# Patient Record
Sex: Female | Born: 1985 | Race: White | Hispanic: No | Marital: Married | Smoking: Never smoker
Health system: Southern US, Community
[De-identification: ages and names within clinical notes are randomized; demographics above are authoritative.]

## PROBLEM LIST (undated history)

## (undated) ENCOUNTER — Inpatient Hospital Stay (HOSPITAL_COMMUNITY): Payer: Self-pay

## (undated) DIAGNOSIS — B999 Unspecified infectious disease: Secondary | ICD-10-CM

## (undated) DIAGNOSIS — N390 Urinary tract infection, site not specified: Secondary | ICD-10-CM

## (undated) DIAGNOSIS — Z809 Family history of malignant neoplasm, unspecified: Secondary | ICD-10-CM

## (undated) DIAGNOSIS — Z833 Family history of diabetes mellitus: Secondary | ICD-10-CM

## (undated) DIAGNOSIS — Z8249 Family history of ischemic heart disease and other diseases of the circulatory system: Secondary | ICD-10-CM

## (undated) DIAGNOSIS — B379 Candidiasis, unspecified: Secondary | ICD-10-CM

## (undated) DIAGNOSIS — Z8619 Personal history of other infectious and parasitic diseases: Secondary | ICD-10-CM

## (undated) DIAGNOSIS — A499 Bacterial infection, unspecified: Secondary | ICD-10-CM

## (undated) DIAGNOSIS — Z22330 Carrier of Group B streptococcus: Secondary | ICD-10-CM

## (undated) DIAGNOSIS — N8110 Cystocele, unspecified: Secondary | ICD-10-CM

## (undated) DIAGNOSIS — Z825 Family history of asthma and other chronic lower respiratory diseases: Secondary | ICD-10-CM

## (undated) HISTORY — DX: Family history of ischemic heart disease and other diseases of the circulatory system: Z82.49

## (undated) HISTORY — DX: Family history of asthma and other chronic lower respiratory diseases: Z82.5

## (undated) HISTORY — DX: Candidiasis, unspecified: B37.9

## (undated) HISTORY — DX: Carrier of group B Streptococcus: Z22.330

## (undated) HISTORY — DX: Family history of malignant neoplasm, unspecified: Z80.9

## (undated) HISTORY — PX: WISDOM TOOTH EXTRACTION: SHX21

## (undated) HISTORY — DX: Family history of diabetes mellitus: Z83.3

## (undated) HISTORY — DX: Bacterial infection, unspecified: A49.9

## (undated) HISTORY — DX: Personal history of other infectious and parasitic diseases: Z86.19

---

## 2010-05-12 ENCOUNTER — Inpatient Hospital Stay (HOSPITAL_COMMUNITY): Admission: AD | Admit: 2010-05-12 | Discharge: 2010-05-12 | Payer: Self-pay | Admitting: Internal Medicine

## 2010-05-13 ENCOUNTER — Inpatient Hospital Stay (HOSPITAL_COMMUNITY): Admission: AD | Admit: 2010-05-13 | Discharge: 2010-05-15 | Payer: Self-pay | Admitting: Obstetrics and Gynecology

## 2010-11-14 LAB — CBC
MCH: 33.2 pg (ref 26.0–34.0)
MCH: 34.5 pg — ABNORMAL HIGH (ref 26.0–34.0)
MCHC: 35.8 g/dL (ref 30.0–36.0)
MCV: 95.4 fL (ref 78.0–100.0)
Platelets: 116 10*3/uL — ABNORMAL LOW (ref 150–400)
Platelets: 158 10*3/uL (ref 150–400)
RDW: 12.9 % (ref 11.5–15.5)
RDW: 13.2 % (ref 11.5–15.5)

## 2011-06-11 LAB — GC/CHLAMYDIA PROBE AMP, GENITAL
Chlamydia: NEGATIVE
Gonorrhea: NEGATIVE

## 2011-06-11 LAB — ABO/RH

## 2011-09-02 NOTE — L&D Delivery Note (Signed)
Delivery Note At 10:06 AM a viable female, "Margaret Leonard", was delivered via Vaginal, Spontaneous Delivery (Presentation: Right Occiput Anterior, compound presentation of left hand).  APGAR: 9, 9; weight 8 lb 15.2 oz (4060 g).   Placenta status: Intact, Spontaneous.  Cord: 3 vessels with the following complications: None.  Cord pH: NA  Anesthesia: Epidural  Episiotomy: None Lacerations: 2nd degree;Perineal Suture Repair: 3.0 monocryl Est. Blood Loss (mL): 200 cc  Mom to postpartum.  Baby to skin to skin. Inpatient circumcision planned--R&B reviewed, consent signed.  Jerzee Jerome 12/09/2011, 10:45 AM

## 2011-10-08 ENCOUNTER — Encounter (HOSPITAL_COMMUNITY): Payer: Self-pay | Admitting: *Deleted

## 2011-10-08 ENCOUNTER — Inpatient Hospital Stay (HOSPITAL_COMMUNITY)
Admission: AD | Admit: 2011-10-08 | Discharge: 2011-10-08 | Disposition: A | Discharge status: Home / Self Care | Payer: BC Managed Care – PPO | Source: Ambulatory Visit | Attending: Obstetrics and Gynecology | Admitting: Obstetrics and Gynecology

## 2011-10-08 DIAGNOSIS — S93409A Sprain of unspecified ligament of unspecified ankle, initial encounter: Secondary | ICD-10-CM | POA: Insufficient documentation

## 2011-10-08 DIAGNOSIS — W108XXA Fall (on) (from) other stairs and steps, initial encounter: Secondary | ICD-10-CM | POA: Insufficient documentation

## 2011-10-08 DIAGNOSIS — N898 Other specified noninflammatory disorders of vagina: Secondary | ICD-10-CM

## 2011-10-08 DIAGNOSIS — O99891 Other specified diseases and conditions complicating pregnancy: Secondary | ICD-10-CM | POA: Insufficient documentation

## 2011-10-08 DIAGNOSIS — T1490XA Injury, unspecified, initial encounter: Secondary | ICD-10-CM

## 2011-10-08 HISTORY — DX: Urinary tract infection, site not specified: N39.0

## 2011-10-08 LAB — AMNISURE RUPTURE OF MEMBRANE (ROM) NOT AT ARMC: Amnisure ROM: NEGATIVE

## 2011-10-08 NOTE — ED Notes (Signed)
Charting at 1740-1745-wrong pt

## 2011-10-08 NOTE — Progress Notes (Addendum)
Pt taken to rm, CNM here for assesment.  Going down steps, 1500 02/05, missed last step, ankle knee took the worst of it.  Saw orthopedic.

## 2011-10-08 NOTE — Progress Notes (Addendum)
History     Chief Complaint  Patient presents with  . Laqueta Jean [facility] [birth year] presents with c/o fell off bottom step when ankle twisted and landed on foot and knee and side at 1500 on 10/07/2011 denies vag bleeding or uc or abd pain states bruising to R ankle and Dr. Alfonzo Beers today with walking boot for a sprained ankle, with trickle of fluid at 1845 2/5 x 1 none since.  @SFHPI @  OB History    Grav Para Term Preterm Abortions TAB SAB Ect Mult Living   1               No past medical history on file.  History reviewed. No pertinent past surgical history.  No family history on file.  History  Substance Use Topics  . Smoking status: Not on file  . Smokeless tobacco: Not on file  . Alcohol Use: Not on file    Allergies: Allergies not on file  No prescriptions prior to admission    @ROS @ Physical Exam  Cheerful, talkative, no obvious distress, abd soft, gravid, nt SSE white discharge, cervix LTC, wet prep and amnisure sent to lab Blood pressure 104/62, pulse 82, temperature 97.3 F (36.3 C), temperature source Oral, resp. rate 20.  @PHYSEXAMBYAGE2 @  ED Course  33 week IUP Post fall spained R ankle P Amnisure and wet prep pending Lavera Guise, CNM   Addendum: neg amnisure, neg wet prep, fhts with reactive NST, discharge s/s ptl abd pain to report, f/o office as schedule. Collaboration per telephone with Dr. Normand Sloop. Lavera Guise, CNM at [phone]

## 2011-10-08 NOTE — Progress Notes (Deleted)
Running up steps, fell and hit abd, knees.  Red areas on knees, not on abd.  "knocked the breath out of me" (happended around 4)

## 2011-10-23 LAB — STREP B DNA PROBE: GBS: NEGATIVE

## 2011-10-31 ENCOUNTER — Encounter (INDEPENDENT_AMBULATORY_CARE_PROVIDER_SITE_OTHER): Payer: BC Managed Care – PPO

## 2011-10-31 DIAGNOSIS — Z331 Pregnant state, incidental: Secondary | ICD-10-CM

## 2011-11-07 ENCOUNTER — Encounter (INDEPENDENT_AMBULATORY_CARE_PROVIDER_SITE_OTHER): Payer: BC Managed Care – PPO | Admitting: Obstetrics and Gynecology

## 2011-11-07 ENCOUNTER — Encounter: Payer: BC Managed Care – PPO | Admitting: Obstetrics and Gynecology

## 2011-11-07 DIAGNOSIS — Z331 Pregnant state, incidental: Secondary | ICD-10-CM

## 2011-11-07 DIAGNOSIS — N898 Other specified noninflammatory disorders of vagina: Secondary | ICD-10-CM

## 2011-11-14 ENCOUNTER — Encounter (INDEPENDENT_AMBULATORY_CARE_PROVIDER_SITE_OTHER): Payer: BC Managed Care – PPO | Admitting: Registered Nurse

## 2011-11-14 DIAGNOSIS — Z331 Pregnant state, incidental: Secondary | ICD-10-CM

## 2011-11-19 ENCOUNTER — Other Ambulatory Visit (INDEPENDENT_AMBULATORY_CARE_PROVIDER_SITE_OTHER): Payer: BC Managed Care – PPO

## 2011-11-19 ENCOUNTER — Encounter (INDEPENDENT_AMBULATORY_CARE_PROVIDER_SITE_OTHER): Payer: BC Managed Care – PPO | Admitting: Registered Nurse

## 2011-11-19 DIAGNOSIS — Z331 Pregnant state, incidental: Secondary | ICD-10-CM

## 2011-11-19 DIAGNOSIS — O26849 Uterine size-date discrepancy, unspecified trimester: Secondary | ICD-10-CM

## 2011-11-27 ENCOUNTER — Encounter: Payer: BC Managed Care – PPO | Admitting: Registered Nurse

## 2011-11-27 ENCOUNTER — Encounter (INDEPENDENT_AMBULATORY_CARE_PROVIDER_SITE_OTHER): Payer: BC Managed Care – PPO

## 2011-11-27 ENCOUNTER — Encounter (INDEPENDENT_AMBULATORY_CARE_PROVIDER_SITE_OTHER): Payer: BC Managed Care – PPO | Admitting: Registered Nurse

## 2011-11-27 DIAGNOSIS — Z331 Pregnant state, incidental: Secondary | ICD-10-CM

## 2011-11-27 DIAGNOSIS — O409XX0 Polyhydramnios, unspecified trimester, not applicable or unspecified: Secondary | ICD-10-CM

## 2011-11-27 DIAGNOSIS — O480 Post-term pregnancy: Secondary | ICD-10-CM

## 2011-12-03 ENCOUNTER — Encounter (INDEPENDENT_AMBULATORY_CARE_PROVIDER_SITE_OTHER): Payer: BC Managed Care – PPO | Admitting: Registered Nurse

## 2011-12-03 DIAGNOSIS — O480 Post-term pregnancy: Secondary | ICD-10-CM

## 2011-12-03 DIAGNOSIS — Z331 Pregnant state, incidental: Secondary | ICD-10-CM

## 2011-12-05 ENCOUNTER — Encounter (INDEPENDENT_AMBULATORY_CARE_PROVIDER_SITE_OTHER): Payer: BC Managed Care – PPO | Admitting: Obstetrics and Gynecology

## 2011-12-05 ENCOUNTER — Other Ambulatory Visit: Payer: BC Managed Care – PPO

## 2011-12-08 ENCOUNTER — Encounter (HOSPITAL_COMMUNITY): Payer: Self-pay | Admitting: *Deleted

## 2011-12-08 ENCOUNTER — Telehealth (HOSPITAL_COMMUNITY): Payer: Self-pay | Admitting: *Deleted

## 2011-12-08 NOTE — Telephone Encounter (Signed)
Preadmission screen  

## 2011-12-09 ENCOUNTER — Encounter (HOSPITAL_COMMUNITY): Payer: Self-pay | Admitting: Anesthesiology

## 2011-12-09 ENCOUNTER — Inpatient Hospital Stay (HOSPITAL_COMMUNITY)
Admission: RE | Admit: 2011-12-09 | Discharge: 2011-12-11 | DRG: 372 | Disposition: A | Discharge status: Home / Self Care | Payer: BC Managed Care – PPO | Source: Ambulatory Visit | Attending: Obstetrics and Gynecology | Admitting: Obstetrics and Gynecology

## 2011-12-09 ENCOUNTER — Encounter (HOSPITAL_COMMUNITY): Payer: Self-pay

## 2011-12-09 ENCOUNTER — Inpatient Hospital Stay (HOSPITAL_COMMUNITY): Payer: BC Managed Care – PPO | Admitting: Anesthesiology

## 2011-12-09 DIAGNOSIS — Z3490 Encounter for supervision of normal pregnancy, unspecified, unspecified trimester: Secondary | ICD-10-CM

## 2011-12-09 DIAGNOSIS — O328XX0 Maternal care for other malpresentation of fetus, not applicable or unspecified: Secondary | ICD-10-CM | POA: Diagnosis present

## 2011-12-09 DIAGNOSIS — D696 Thrombocytopenia, unspecified: Secondary | ICD-10-CM | POA: Diagnosis not present

## 2011-12-09 DIAGNOSIS — D689 Coagulation defect, unspecified: Secondary | ICD-10-CM | POA: Diagnosis not present

## 2011-12-09 LAB — CBC
Hemoglobin: 12.7 g/dL (ref 12.0–15.0)
MCH: 31.9 pg (ref 26.0–34.0)
MCHC: 34.1 g/dL (ref 30.0–36.0)
MCV: 93.5 fL (ref 78.0–100.0)
Platelets: 132 10*3/uL — ABNORMAL LOW (ref 150–400)
RBC: 3.98 MIL/uL (ref 3.87–5.11)

## 2011-12-09 LAB — RPR: RPR Ser Ql: NONREACTIVE

## 2011-12-09 MED ORDER — EPHEDRINE 5 MG/ML INJ: 10.0000 mg | INTRAVENOUS | Status: DC | PRN

## 2011-12-09 MED ORDER — LANOLIN HYDROUS EX OINT: TOPICAL_OINTMENT | CUTANEOUS | Status: DC | PRN

## 2011-12-09 MED ORDER — OXYTOCIN BOLUS FROM INFUSION
500.0000 mL | Freq: Once | INTRAVENOUS | Status: DC
  Filled 2011-12-09: qty 1000
  Filled 2011-12-09: qty 500

## 2011-12-09 MED ORDER — OXYTOCIN 20 UNITS IN LACTATED RINGERS INFUSION - SIMPLE
125.0000 mL/h | Freq: Once | INTRAVENOUS | Status: AC
  Administered 2011-12-09: 999 mL/h via INTRAVENOUS

## 2011-12-09 MED ORDER — ONDANSETRON HCL 4 MG PO TABS: 4.0000 mg | ORAL_TABLET | ORAL | Status: DC | PRN

## 2011-12-09 MED ORDER — IBUPROFEN 600 MG PO TABS
600.0000 mg | ORAL_TABLET | Freq: Four times a day (QID) | ORAL | Status: DC
  Administered 2011-12-09 – 2011-12-11 (×9): 600 mg via ORAL
  Filled 2011-12-09 (×8): qty 1

## 2011-12-09 MED ORDER — PHENYLEPHRINE 40 MCG/ML (10ML) SYRINGE FOR IV PUSH (FOR BLOOD PRESSURE SUPPORT)
80.0000 ug | PREFILLED_SYRINGE | INTRAVENOUS | Status: DC | PRN
  Filled 2011-12-09: qty 5

## 2011-12-09 MED ORDER — LACTATED RINGERS IV SOLN
INTRAVENOUS | Status: DC
  Administered 2011-12-09: 06:00:00 via INTRAVENOUS

## 2011-12-09 MED ORDER — DIPHENHYDRAMINE HCL 50 MG/ML IJ SOLN: 12.5000 mg | INTRAMUSCULAR | Status: DC | PRN

## 2011-12-09 MED ORDER — SIMETHICONE 80 MG PO CHEW: 80.0000 mg | CHEWABLE_TABLET | ORAL | Status: DC | PRN

## 2011-12-09 MED ORDER — LACTATED RINGERS IV SOLN: 500.0000 mL | Freq: Once | INTRAVENOUS | Status: DC

## 2011-12-09 MED ORDER — PHENYLEPHRINE 40 MCG/ML (10ML) SYRINGE FOR IV PUSH (FOR BLOOD PRESSURE SUPPORT): 80.0000 ug | PREFILLED_SYRINGE | INTRAVENOUS | Status: DC | PRN

## 2011-12-09 MED ORDER — OXYCODONE-ACETAMINOPHEN 5-325 MG PO TABS: 1.0000 | ORAL_TABLET | ORAL | Status: DC | PRN

## 2011-12-09 MED ORDER — FENTANYL 2.5 MCG/ML BUPIVACAINE 1/10 % EPIDURAL INFUSION (WH - ANES)
14.0000 mL/h | INTRAMUSCULAR | Status: DC
  Administered 2011-12-09: 14 mL/h via EPIDURAL
  Filled 2011-12-09 (×2): qty 60

## 2011-12-09 MED ORDER — LACTATED RINGERS IV SOLN: 500.0000 mL | INTRAVENOUS | Status: DC | PRN

## 2011-12-09 MED ORDER — SENNOSIDES-DOCUSATE SODIUM 8.6-50 MG PO TABS
2.0000 | ORAL_TABLET | Freq: Every day | ORAL | Status: DC
  Administered 2011-12-09 – 2011-12-10 (×2): 2 via ORAL

## 2011-12-09 MED ORDER — DIBUCAINE 1 % RE OINT: 1.0000 "application " | TOPICAL_OINTMENT | RECTAL | Status: DC | PRN

## 2011-12-09 MED ORDER — ACETAMINOPHEN 325 MG PO TABS: 650.0000 mg | ORAL_TABLET | ORAL | Status: DC | PRN

## 2011-12-09 MED ORDER — ONDANSETRON HCL 4 MG/2ML IJ SOLN: 4.0000 mg | Freq: Four times a day (QID) | INTRAMUSCULAR | Status: DC | PRN

## 2011-12-09 MED ORDER — ONDANSETRON HCL 4 MG/2ML IJ SOLN: 4.0000 mg | INTRAMUSCULAR | Status: DC | PRN

## 2011-12-09 MED ORDER — TETANUS-DIPHTH-ACELL PERTUSSIS 5-2.5-18.5 LF-MCG/0.5 IM SUSP: 0.5000 mL | Freq: Once | INTRAMUSCULAR | Status: DC

## 2011-12-09 MED ORDER — FENTANYL 2.5 MCG/ML BUPIVACAINE 1/10 % EPIDURAL INFUSION (WH - ANES)
INTRAMUSCULAR | Status: DC | PRN
  Administered 2011-12-09: 14 mL/h via EPIDURAL

## 2011-12-09 MED ORDER — LIDOCAINE HCL (PF) 1 % IJ SOLN
30.0000 mL | INTRAMUSCULAR | Status: DC | PRN
  Filled 2011-12-09 (×2): qty 30

## 2011-12-09 MED ORDER — WITCH HAZEL-GLYCERIN EX PADS: 1.0000 "application " | MEDICATED_PAD | CUTANEOUS | Status: DC | PRN

## 2011-12-09 MED ORDER — BENZOCAINE-MENTHOL 20-0.5 % EX AERO
INHALATION_SPRAY | CUTANEOUS | Status: AC
  Administered 2011-12-09: 15:00:00
  Filled 2011-12-09: qty 56

## 2011-12-09 MED ORDER — DIPHENHYDRAMINE HCL 25 MG PO CAPS: 25.0000 mg | ORAL_CAPSULE | Freq: Four times a day (QID) | ORAL | Status: DC | PRN

## 2011-12-09 MED ORDER — IBUPROFEN 600 MG PO TABS
600.0000 mg | ORAL_TABLET | Freq: Four times a day (QID) | ORAL | Status: DC | PRN
  Administered 2011-12-09: 600 mg via ORAL
  Filled 2011-12-09: qty 1

## 2011-12-09 MED ORDER — ZOLPIDEM TARTRATE 5 MG PO TABS: 5.0000 mg | ORAL_TABLET | Freq: Every evening | ORAL | Status: DC | PRN

## 2011-12-09 MED ORDER — LIDOCAINE HCL (PF) 1 % IJ SOLN
INTRAMUSCULAR | Status: DC | PRN
  Administered 2011-12-09: 4 mL
  Administered 2011-12-09: 30 mL
  Administered 2011-12-09: 4 mL

## 2011-12-09 MED ORDER — BENZOCAINE-MENTHOL 20-0.5 % EX AERO: 1.0000 "application " | INHALATION_SPRAY | CUTANEOUS | Status: DC | PRN

## 2011-12-09 MED ORDER — CITRIC ACID-SODIUM CITRATE 334-500 MG/5ML PO SOLN: 30.0000 mL | ORAL | Status: DC | PRN

## 2011-12-09 MED ORDER — PRENATAL MULTIVITAMIN CH
1.0000 | ORAL_TABLET | Freq: Every day | ORAL | Status: DC
  Administered 2011-12-09 – 2011-12-11 (×3): 1 via ORAL
  Filled 2011-12-09 (×3): qty 1

## 2011-12-09 MED ORDER — OXYCODONE-ACETAMINOPHEN 5-325 MG PO TABS
1.0000 | ORAL_TABLET | ORAL | Status: DC | PRN
  Administered 2011-12-09 – 2011-12-10 (×2): 1 via ORAL
  Administered 2011-12-10: 2 via ORAL
  Administered 2011-12-10: 1 via ORAL
  Filled 2011-12-09 (×3): qty 1
  Filled 2011-12-09: qty 2

## 2011-12-09 MED ORDER — FLEET ENEMA 7-19 GM/118ML RE ENEM: 1.0000 | ENEMA | RECTAL | Status: DC | PRN

## 2011-12-09 MED ORDER — EPHEDRINE 5 MG/ML INJ
10.0000 mg | INTRAVENOUS | Status: DC | PRN
  Filled 2011-12-09: qty 4

## 2011-12-09 NOTE — Anesthesia Procedure Notes (Signed)
Epidural Patient location during procedure: OB Start time: 12/09/2011 6:42 AM  Staffing Anesthesiologist: Malen Gauze, Ahlaya Ende A. Performed by: anesthesiologist   Preanesthetic Checklist Completed: patient identified, site marked, surgical consent, pre-op evaluation, timeout performed, IV checked, risks and benefits discussed and monitors and equipment checked  Epidural Patient position: sitting Prep: site prepped and draped and DuraPrep Patient monitoring: continuous pulse ox and blood pressure Approach: midline Injection technique: LOR air  Needle:  Needle type: Tuohy  Needle gauge: 17 G Needle length: 9 cm Needle insertion depth: 4 cm Catheter type: closed end flexible Catheter size: 19 Gauge Catheter at skin depth: 9 cm Test dose: negative and Other  Assessment Events: blood not aspirated, injection not painful, no injection resistance, negative IV test and no paresthesia  Additional Notes Patient identified. Risks and benefits discussed including failed block, incomplete  Pain control, post dural puncture headache, nerve damage, paralysis, blood pressure Changes, nausea, vomiting, reactions to medications-both toxic and allergic and post Partum back pain. All questions were answered. Patient expressed understanding and wished to proceed. Sterile technique was used throughout procedure. Epidural site was Dressed with sterile barrier dressing. No paresthesias, signs of intravascular injection Or signs of intrathecal spread were encountered.  Patient was more comfortable after the epidural was dosed. Please see RN's note for documentation of vital signs and FHR which are stable.

## 2011-12-09 NOTE — Progress Notes (Signed)
  Subjective: Comfortable with epidural--feeling slight pressure with some contractions.  Objective: BP 128/79  Pulse 79  Temp 97.7 F (36.5 C)  Resp 20  Ht 5\' 5"  (1.651 m)  Wt 151 lb (68.493 kg)  BMI 25.13 kg/m2      FHT:  Category 1 UC:   regular, every 4 minutes 9 cm at 6am by RN exam.  Labs: Lab Results  Component Value Date   WBC 8.9 12/09/2011   HGB 12.7 12/09/2011   HCT 37.2 12/09/2011   MCV 93.5 12/09/2011   PLT 132* 12/09/2011    Assessment / Plan: Spontaneous labor, progressing normally  Plan: Will await increased pressure and urge to push.   Nigel Bridgeman 12/09/2011, 8:12 AM

## 2011-12-09 NOTE — Anesthesia Postprocedure Evaluation (Signed)
  Anesthesia Post Note  Patient: Margaret Leonard  Procedure(s) Performed: * No procedures listed *  Anesthesia type: Epidural  Patient location: Mother/Baby  Post pain: Pain level controlled  Post assessment: Post-op Vital signs reviewed  Last Vitals:  Filed Vitals:   12/09/11 1400  BP: 102/67  Pulse: 87  Temp: 36.7 C  Resp: 18    Post vital signs: Reviewed  Level of consciousness:alert  Complications: No apparent anesthesia complications

## 2011-12-09 NOTE — Anesthesia Preprocedure Evaluation (Addendum)

## 2011-12-10 LAB — CBC
MCH: 32.2 pg (ref 26.0–34.0)
MCHC: 34.1 g/dL (ref 30.0–36.0)
MCV: 94.7 fL (ref 78.0–100.0)
Platelets: 108 10*3/uL — ABNORMAL LOW (ref 150–400)

## 2011-12-10 NOTE — Progress Notes (Signed)
Post Partum Day 1 Subjective: no complaints, up ad lib, voiding and tolerating PO  Objective: Blood pressure 108/72, pulse 91, temperature 98.1 F (36.7 C), temperature source Oral, resp. rate 18, height 5\' 5"  (1.651 m), weight 68.493 kg (151 lb), SpO2 98.00%, unknown if currently breastfeeding.  Physical Exam:  General: alert and cooperative Lochia: appropriate Uterine Fundus: firm Incision: NA DVT Evaluation: No evidence of DVT seen on physical exam.   Basename 12/10/11 0610 12/09/11 0550  HGB 10.9* 12.7  HCT 32.0* 37.2    Assessment/Plan: Plan for discharge tomorrow, Breastfeeding, Circumcision prior to discharge and Contraception Undecided (C and F?).   LOS: 1 day   Savian Mazon V 12/10/2011, 10:42 AM

## 2011-12-11 LAB — CBC
MCHC: 33.6 g/dL (ref 30.0–36.0)
Platelets: 137 10*3/uL — ABNORMAL LOW (ref 150–400)
RDW: 13.5 % (ref 11.5–15.5)
WBC: 10.4 10*3/uL (ref 4.0–10.5)

## 2011-12-11 MED ORDER — IBUPROFEN 600 MG PO TABS: 600.0000 mg | ORAL_TABLET | Freq: Four times a day (QID) | ORAL | Status: AC | PRN

## 2011-12-11 NOTE — Discharge Instructions (Signed)

## 2011-12-11 NOTE — Discharge Summary (Signed)
Obstetric Discharge Summary Reason for Admission: onset of labor Prenatal Procedures: NST and ultrasound Intrapartum Procedures: spontaneous vaginal delivery Postpartum Procedures: none Complications-Operative and Postpartum: 2nd degree perineal laceration, mild throbocytopenia Hemoglobin  Date Value Range Status  12/11/2011 11.1* 12.0-15.0 (g/dL) Final     HCT  Date Value Range Status  12/11/2011 33.0* 36.0-46.0 (%) Final   Results for orders placed during the hospital encounter of 12/09/11 (from the past 48 hour(s))  CBC     Status: Abnormal   Collection Time   12/10/11  6:10 AM      Component Value Range Comment   WBC 10.6 (*) 4.0 - 10.5 (K/uL)    RBC 3.38 (*) 3.87 - 5.11 (MIL/uL)    Hemoglobin 10.9 (*) 12.0 - 15.0 (g/dL)    HCT 76.1 (*) 60.7 - 46.0 (%)    MCV 94.7  78.0 - 100.0 (fL)    MCH 32.2  26.0 - 34.0 (pg)    MCHC 34.1  30.0 - 36.0 (g/dL)    RDW 37.1  06.2 - 69.4 (%)    Platelets 108 (*) 150 - 400 (K/uL)   CBC     Status: Abnormal   Collection Time   12/11/11  7:30 AM      Component Value Range Comment   WBC 10.4  4.0 - 10.5 (K/uL)    RBC 3.44 (*) 3.87 - 5.11 (MIL/uL)    Hemoglobin 11.1 (*) 12.0 - 15.0 (g/dL)    HCT 85.4 (*) 62.7 - 46.0 (%)    MCV 95.9  78.0 - 100.0 (fL)    MCH 32.3  26.0 - 34.0 (pg)    MCHC 33.6  30.0 - 36.0 (g/dL)    RDW 03.5  00.9 - 38.1 (%)    Platelets 137 (*) 150 - 400 (K/uL)      Hospital Course: Admitted in labor 12/09/11.  Negative GBS. Progressed spontaneously, with epidural placed. Delivery was performed by Nigel Bridgeman, CNM, without complication. Patient and baby tolerated the procedure without difficulty, with  2nd degree perineal laceration noted. Infant to FTN. Mother and infant then had an uncomplicated postpartum course, with breast feeding going well. Mom's physical exam was WNL, and she was discharged home in stable condition. Contraception plan was condoms/foam.  She received adequate benefit from po pain medications, and was d/c'd  home with Rx for Motrin.  Platelet count was 132 on admission, 108 PP day 1, and 137 PP day 2 (179 at NOB).  Will consider recheck at 6 weeks.   Physical Exam:  General: alert Lochia: appropriate Uterine Fundus: firm Incision: healing well DVT Evaluation: No evidence of DVT seen on physical exam. Negative Homan's sign.  Discharge Diagnoses: Term Pregnancy-delivered, Mild thrombocytopenia  Discharge Information: Date: 12/11/2011 Activity: Per [facility] handout Diet: routine Medications: Ibuprofen Condition: stable Instructions: refer to practice specific booklet Discharge to: home Follow-up Information    Follow up with Nigel Bridgeman, CNM in 6 weeks. (As scheduled or as needed)    Contact information:   [address] [place] [address] [phone]          Newborn Data: Live born female  Birth Weight: 8 lb 15.2 oz (4060 g) APGAR: 9, 9  Home with mother.  Nigel Bridgeman 12/11/2011, 8:51 AM

## 2011-12-14 ENCOUNTER — Telehealth: Payer: Self-pay | Admitting: Obstetrics and Gynecology

## 2011-12-14 NOTE — Telephone Encounter (Signed)
TC from patient--SVB 4/9, without complications. Temp 99.9 today, feels flu-like symptoms. No N/V, breast symptoms (except for milk coming in today), dysuria, or any Other symptoms.  No one has been ill.  Bleeding small amount, no pelvic pain.  Plan: Will alternate Tylenol and Motrin for q 3 hour dosing of antipyretic. Push po fluids. Rest and continue breastfeeding. Office will call to check on patient in am, and patient is To call with any evidence of mastitis, etc.

## 2011-12-17 NOTE — H&P (Signed)
Margaret Leonard is a 26 y.o.married white female presenting at 41.6 weeks with CC of SROM at 0400, clear fluid, and onset of ctxs thereafter.  Denies any recent illness or fever; no UTI or PIH s/s.  Reports GFM this morning.  No VB.  Pt was scheduled to come in for induction later this morning, had she not gone into spontaneous labor.  She is accompanied by her husband.  She is requesting an epidural.   Pt's prenatal care has been overall unremarkable.  She started care around 16 weeks.  She had normal anatomy scan and quad screen.  She did have a rt ankle sprain around 34 weeks.  FH measured size less than dates late 3rd trimester, and had normal growth u/s at 39 weeks w/ EFW=76%.    OB HX: G1=SVD 2011; female=8+3 at 39.5 weeks; PIH G2=current pregnancy  Maternal Medical History:  Reason for admission: Reason for admission: rupture of membranes and contractions.  Contractions: Onset was 1-2 hours ago.   Frequency: regular.   Perceived severity is moderate.    Fetal activity: Perceived fetal activity is normal.   Last perceived fetal movement was within the past hour.    Prenatal complications: 1.  Several medication allergies:  Ceclor, Vancomycin causes Redman Syndrome; also Tdap. 2.  Late to care 3.  FH polydactyly 4.  H/o PIH last pregnancy      OB History    Grav Para Term Preterm Abortions TAB SAB Ect Mult Living   2 2 2  0 0 0 0 0 0 2     Past Medical History  Diagnosis Date  . Complication of anesthesia   . Urinary tract infection    Past Surgical History  Procedure Date  . No past surgeries    Family History: family history includes Birth defects in her mother; Cancer in her paternal grandmother; Diabetes in her maternal grandfather; Heart disease in her paternal grandfather; Hypertension in her maternal grandmother and mother; and Other in her cousin.  There is no history of Anesthesia problems. Social History:  reports that she has never smoked. She has never used  smokeless tobacco. 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  Her husband "Susy Frizzle" has had 20 yrs of education and is a Engineer, production.  Review of Systems  Constitutional: Negative.   HENT: Negative.   Eyes: Negative.   Respiratory: Negative.   Cardiovascular: Negative.   Gastrointestinal: Positive for diarrhea.       Loose stools recently--primarily when more frequent ctxs  Genitourinary: Negative.   Skin: Negative.   Neurological: Negative.     Blood pressure 98/61, pulse 90, temperature 98 F (36.7 C), temperature source Oral, resp. rate 16, height 5\' 5"  (1.651 m), weight 68.493 kg (151 lb), SpO2 98.00%, unknown if currently breastfeeding. Maternal Exam:  Uterine Assessment: Contraction strength is moderate.  Contraction frequency is regular.  UC's 6-7 min  Abdomen: Patient reports no abdominal tenderness. Estimated fetal weight is 8-9 lbs.   Fetal presentation: vertex  Introitus: not evaluated.   Pelvis: adequate for delivery.   Cervix: Cervix evaluated by digital exam.     Fetal Exam Fetal Monitor Review: Mode: ultrasound.   Baseline rate: 125.  Variability: moderate (6-25 bpm).   Pattern: accelerations present and no decelerations.    Fetal State Assessment: Category I - tracings are normal.     Physical Exam  Constitutional: She appears well-developed and  well-nourished. She appears distressed.       Breathing w/ ctxs and squirmy in bed w/ them  HENT:  Head: Normocephalic and atraumatic.  Cardiovascular: Normal rate.   Respiratory: Effort normal and breath sounds normal.  GI: Soft.       gravid  Genitourinary:       cx per RN on arrival =6.5/90/-1  Skin: Skin is warm.       Skin clammy, diaphoretic    Prenatal labs: ABO, Rh: A/Positive/-- (10/10 0000) Antibody: Negative (10/10 0000) Rubella: Immune (10/10 0000) RPR: NON REACTIVE (04/09 0550)  HBsAg: Negative (10/10 0000)  HIV:  Non-reactive (10/10 0000)  GBS: Negative (02/21 0000)  Quad screen negative 1hr gtt WNL Assessment/Plan: 1.  IUP at 41.6 2. SROM at 0400 3.  Active labor s/p SROM 4.  GBS neg 5.  Cat I FHT 6.  Desires epidural  1.  Admit to [facility] w/ Dr. Estanislado Pandy as attending w/ routine L&D orders 2.  Epidural ASAP 3.  Pitocin prn augmentation 4.  Support as needed 5.  Anticipate SVD 6.  C/w MD prn   Laure Leone H 12/17/2011, 12:10 AM

## 2011-12-24 ENCOUNTER — Telehealth: Payer: Self-pay | Admitting: Obstetrics and Gynecology

## 2011-12-24 NOTE — Telephone Encounter (Signed)
PT CALLED IS 2WKS SVD, STATES "FEELS LIKE A BALL INSIDE HER VAGINA" THAT IS CAUSING PRESSURE WHEN SHE URINATES, TRIED TO URINATE AND NOTHING CAME OUT, IS NOT HAVING ANY PAIN, BLDG IS DECREASING, PT SCHED APPT ON TOMORROW 12/25/11 @ 1630 W/ SL.

## 2011-12-25 ENCOUNTER — Encounter: Payer: Self-pay | Admitting: Obstetrics and Gynecology

## 2011-12-25 ENCOUNTER — Ambulatory Visit (INDEPENDENT_AMBULATORY_CARE_PROVIDER_SITE_OTHER): Payer: BC Managed Care – PPO | Admitting: Obstetrics and Gynecology

## 2011-12-25 VITALS — BP 112/60 | Wt 137.0 lb

## 2011-12-25 DIAGNOSIS — N8111 Cystocele, midline: Secondary | ICD-10-CM

## 2011-12-25 DIAGNOSIS — R300 Dysuria: Secondary | ICD-10-CM

## 2011-12-25 DIAGNOSIS — IMO0002 Reserved for concepts with insufficient information to code with codable children: Secondary | ICD-10-CM

## 2011-12-25 LAB — POCT URINALYSIS DIPSTICK
Ketones, UA: NEGATIVE
Leukocytes, UA: NEGATIVE
Nitrite, UA: NEGATIVE
Protein, UA: NEGATIVE

## 2011-12-25 NOTE — Progress Notes (Signed)
Vaginal discharge: yellowthick mucoid Itching / Burning: yes Fever: no  Symptoms have been present for 4 days. Has used over-the-counter treatment: no Associated symptoms:  Pelvic pain: no       Dyspareunia: yes     Odor:  yes  History of STD:  no history of PID, STD's STD screen:declined  Vaginal Discharge/Discomfort/Itching  Subjective:   Margaret Leonard is an 26 y.o. woman who presents c/o pressure under the bladder for 4 days. Mild dysuria.Incomplete emptying.Not concerned about normal lochia.  Objective: no lesions, no discharge, no CMT, no adnexal masses B and Grade 4 cystocele Vaginal discharge: no vaginal discharge Vaginal lesions:  none    Assessment: Post-partum cystocele  Plan: Kegels 2x daily to reevaluate at [facility] visit. +/- Physical therapy?     Follow-up: as scheduled  Ronen Bromwell A MD 12/25/2011 3:12 PM

## 2012-01-15 ENCOUNTER — Ambulatory Visit (INDEPENDENT_AMBULATORY_CARE_PROVIDER_SITE_OTHER): Payer: BC Managed Care – PPO | Admitting: Obstetrics and Gynecology

## 2012-01-15 ENCOUNTER — Encounter: Payer: Self-pay | Admitting: Obstetrics and Gynecology

## 2012-01-15 VITALS — BP 110/70 | Resp 14

## 2012-01-15 DIAGNOSIS — O99113 Other diseases of the blood and blood-forming organs and certain disorders involving the immune mechanism complicating pregnancy, third trimester: Secondary | ICD-10-CM | POA: Insufficient documentation

## 2012-01-15 DIAGNOSIS — IMO0002 Reserved for concepts with insufficient information to code with codable children: Secondary | ICD-10-CM | POA: Insufficient documentation

## 2012-01-15 DIAGNOSIS — D696 Thrombocytopenia, unspecified: Secondary | ICD-10-CM | POA: Insufficient documentation

## 2012-01-15 LAB — POCT URINALYSIS DIPSTICK
Bilirubin, UA: NEGATIVE
Leukocytes, UA: NEGATIVE
Nitrite, UA: NEGATIVE
Protein, UA: NEGATIVE
Urobilinogen, UA: NEGATIVE
pH, UA: 7

## 2012-01-15 LAB — CBC
MCHC: 33.7 g/dL (ref 30.0–36.0)
Platelets: 190 10*3/uL (ref 150–400)
RDW: 12.3 % (ref 11.5–15.5)

## 2012-01-15 NOTE — Progress Notes (Signed)
Date of delivery: 12/09/2011 Female Name: Margaret Leonard Vaginal delivery:yes Cesarean section:no Tubal ligation:no GDM:no Breast Feeding:yes Bottle Feeding:no Post-Partum Blues:no Abnormal pap:no Normal GU function: no - c/o prolapse bladder with leakage Normal GI function:yes Returning to work:no  Pt doesn't desire BC at this time  EPDS 4

## 2012-01-15 NOTE — Progress Notes (Signed)
Subjective:     Margaret Leonard is a 26 y.o. female who presents for a postpartum visit.  I have fully reviewed the prenatal and intrapartum course.  Was seen in April by Dr. Estanislado Pandy for pp cystocele.  Patient doing Kegels, no further pelvic/bladder pressure or dysuria.      Patient is not sexually active yet.  The following portions of the patient's history were reviewed and updated as appropriate: allergies, current medications, past family history, past medical history, past social history, past surgical history and problem list.  Review of Systems Pertinent items are noted in HPI.   Objective:    BP 110/70  Resp 14  Breastfeeding? Yes  General:  alert, cooperative and no distress     Lungs: clear to auscultation bilaterally  Heart:  regular rate and rhythm, S1, S2 normal, no murmur  Abdomen: soft, non-tender; bowel sounds normal; no masses,  no organomegaly   Vulva:  normal  Vagina: normal vagina  Cervix:  normal  Corpus: normal size, contour, position, consistency, mobility, non-tender  Adnexa:  normal adnexa       Mild cyctocele noted with intrabdominal pressure. No leaking of urine on exam. Palpable Kegel strength = approx 1/3 of expected strength.      Assessment:     Normal postpartum exam.  Pap smear not done at today's visit.  Improvement of pp cystocele.  Plan:     1. Contraception: condoms 2. Follow up in: January 2014 for pap or prn for any issues.   3. CBC today.  4. Continue Kegels--call if any issues do not continue to improve.  Would send to PT in that situation.  Nigel Bridgeman MD 01/15/2012 9:12 AM

## 2013-05-04 LAB — OB RESULTS CONSOLE GC/CHLAMYDIA
Chlamydia: NEGATIVE
Gonorrhea: NEGATIVE

## 2013-05-04 LAB — OB RESULTS CONSOLE HIV ANTIBODY (ROUTINE TESTING): HIV: NONREACTIVE

## 2013-05-06 LAB — OB RESULTS CONSOLE RPR: RPR: NONREACTIVE

## 2013-06-28 LAB — OB RESULTS CONSOLE GBS: STREP GROUP B AG: POSITIVE

## 2013-09-01 NOTE — L&D Delivery Note (Signed)
  1310: Patient called out reporting increase in rectal pressure.  Upon exam, 8/90/+1 and feeling urge to push.  After two additional contractions, patient feeling increased pressure and urge to push, called out.  Exam, patient with anterior lip.  Patient given option to push to melt away cervix and agreed.  Patient pushed to complete dilation & effacement, infant +2 station. Delivery as noted below.   Delivery Note At 1:31 PM a viable female "Carlena BjornstadDavid John" was delivered via Vaginal, Spontaneous Delivery (Presentation: Right Occiput Anterior).  APGAR: 8, 9; weight .  Placenta status: Intact, Spontaneous.  Cord: 3 vessels with the following complications: None.  Cord pH:N/A  Anesthesia: Epidural  Episiotomy: None Lacerations: 2nd degree;Perineal Suture Repair: 3.0 vicryl Est. Blood Loss (mL): 200  Mom to postpartum.  Baby to Couplet care / Skin to Skin.  Patient requests outpatient circumcision with Dr. Kathie RhodesS. Rivard  Joyice FasterJessica Lynn [facility]irmary[facility] 12/12/2013, 2:21 PM

## 2013-12-12 ENCOUNTER — Encounter (HOSPITAL_COMMUNITY): Payer: Self-pay | Admitting: *Deleted

## 2013-12-12 ENCOUNTER — Inpatient Hospital Stay (HOSPITAL_COMMUNITY): Payer: BC Managed Care – PPO | Admitting: Anesthesiology

## 2013-12-12 ENCOUNTER — Encounter (HOSPITAL_COMMUNITY): Payer: BC Managed Care – PPO | Admitting: Anesthesiology

## 2013-12-12 ENCOUNTER — Inpatient Hospital Stay (HOSPITAL_COMMUNITY)
Admission: AD | Admit: 2013-12-12 | Discharge: 2013-12-14 | DRG: 775 | Disposition: A | Discharge status: Home / Self Care | Payer: BC Managed Care – PPO | Source: Ambulatory Visit | Attending: Obstetrics and Gynecology | Admitting: Obstetrics and Gynecology

## 2013-12-12 DIAGNOSIS — O99892 Other specified diseases and conditions complicating childbirth: Secondary | ICD-10-CM | POA: Diagnosis present

## 2013-12-12 DIAGNOSIS — O9912 Other diseases of the blood and blood-forming organs and certain disorders involving the immune mechanism complicating childbirth: Secondary | ICD-10-CM

## 2013-12-12 DIAGNOSIS — Z880 Allergy status to penicillin: Secondary | ICD-10-CM

## 2013-12-12 DIAGNOSIS — Z833 Family history of diabetes mellitus: Secondary | ICD-10-CM

## 2013-12-12 DIAGNOSIS — D696 Thrombocytopenia, unspecified: Secondary | ICD-10-CM | POA: Diagnosis present

## 2013-12-12 DIAGNOSIS — O9989 Other specified diseases and conditions complicating pregnancy, childbirth and the puerperium: Secondary | ICD-10-CM

## 2013-12-12 DIAGNOSIS — Z22330 Carrier of Group B streptococcus: Secondary | ICD-10-CM

## 2013-12-12 DIAGNOSIS — D689 Coagulation defect, unspecified: Secondary | ICD-10-CM | POA: Diagnosis present

## 2013-12-12 DIAGNOSIS — IMO0001 Reserved for inherently not codable concepts without codable children: Secondary | ICD-10-CM

## 2013-12-12 HISTORY — DX: Cystocele, unspecified: N81.10

## 2013-12-12 HISTORY — DX: Unspecified infectious disease: B99.9

## 2013-12-12 LAB — CBC
HEMATOCRIT: 35.6 % — AB (ref 36.0–46.0)
HEMOGLOBIN: 12.3 g/dL (ref 12.0–15.0)
MCH: 31.5 pg (ref 26.0–34.0)
MCHC: 34.6 g/dL (ref 30.0–36.0)
MCV: 91 fL (ref 78.0–100.0)
Platelets: 141 10*3/uL — ABNORMAL LOW (ref 150–400)
RBC: 3.91 MIL/uL (ref 3.87–5.11)
RDW: 12.5 % (ref 11.5–15.5)
WBC: 14.6 10*3/uL — ABNORMAL HIGH (ref 4.0–10.5)

## 2013-12-12 LAB — RPR

## 2013-12-12 MED ORDER — SENNOSIDES-DOCUSATE SODIUM 8.6-50 MG PO TABS
2.0000 | ORAL_TABLET | ORAL | Status: DC
  Administered 2013-12-12 – 2013-12-13 (×2): 2 via ORAL
  Filled 2013-12-12 (×2): qty 2

## 2013-12-12 MED ORDER — LACTATED RINGERS IV SOLN
500.0000 mL | Freq: Once | INTRAVENOUS | Status: AC
  Administered 2013-12-12: 500 mL via INTRAVENOUS

## 2013-12-12 MED ORDER — CITRIC ACID-SODIUM CITRATE 334-500 MG/5ML PO SOLN: 30.0000 mL | ORAL | Status: DC | PRN

## 2013-12-12 MED ORDER — EPHEDRINE 5 MG/ML INJ
10.0000 mg | INTRAVENOUS | Status: DC | PRN
  Filled 2013-12-12: qty 4
  Filled 2013-12-12: qty 2

## 2013-12-12 MED ORDER — EPHEDRINE 5 MG/ML INJ
10.0000 mg | INTRAVENOUS | Status: DC | PRN
  Filled 2013-12-12: qty 2

## 2013-12-12 MED ORDER — CLINDAMYCIN PHOSPHATE 900 MG/50ML IV SOLN
900.0000 mg | Freq: Three times a day (TID) | INTRAVENOUS | Status: DC
  Administered 2013-12-12: 900 mg via INTRAVENOUS
  Filled 2013-12-12 (×2): qty 50

## 2013-12-12 MED ORDER — DIPHENHYDRAMINE HCL 25 MG PO CAPS: 25.0000 mg | ORAL_CAPSULE | Freq: Four times a day (QID) | ORAL | Status: DC | PRN

## 2013-12-12 MED ORDER — PHENYLEPHRINE 40 MCG/ML (10ML) SYRINGE FOR IV PUSH (FOR BLOOD PRESSURE SUPPORT)
80.0000 ug | PREFILLED_SYRINGE | INTRAVENOUS | Status: DC | PRN
  Filled 2013-12-12: qty 2
  Filled 2013-12-12: qty 10

## 2013-12-12 MED ORDER — SIMETHICONE 80 MG PO CHEW: 80.0000 mg | CHEWABLE_TABLET | ORAL | Status: DC | PRN

## 2013-12-12 MED ORDER — ONDANSETRON HCL 4 MG PO TABS: 4.0000 mg | ORAL_TABLET | ORAL | Status: DC | PRN

## 2013-12-12 MED ORDER — PRENATAL MULTIVITAMIN CH
1.0000 | ORAL_TABLET | Freq: Every day | ORAL | Status: DC
  Administered 2013-12-13 – 2013-12-14 (×2): 1 via ORAL
  Filled 2013-12-12 (×2): qty 1

## 2013-12-12 MED ORDER — LANOLIN HYDROUS EX OINT: TOPICAL_OINTMENT | CUTANEOUS | Status: DC | PRN

## 2013-12-12 MED ORDER — LIDOCAINE HCL (PF) 1 % IJ SOLN
30.0000 mL | INTRAMUSCULAR | Status: DC | PRN
  Filled 2013-12-12: qty 30

## 2013-12-12 MED ORDER — ACETAMINOPHEN 325 MG PO TABS
650.0000 mg | ORAL_TABLET | ORAL | Status: DC | PRN
  Administered 2013-12-12 – 2013-12-13 (×2): 650 mg via ORAL
  Filled 2013-12-12 (×2): qty 2

## 2013-12-12 MED ORDER — PHENYLEPHRINE 40 MCG/ML (10ML) SYRINGE FOR IV PUSH (FOR BLOOD PRESSURE SUPPORT)
80.0000 ug | PREFILLED_SYRINGE | INTRAVENOUS | Status: DC | PRN
  Filled 2013-12-12: qty 2

## 2013-12-12 MED ORDER — LIDOCAINE HCL (PF) 1 % IJ SOLN
INTRAMUSCULAR | Status: DC | PRN
  Administered 2013-12-12 (×4): 4 mL

## 2013-12-12 MED ORDER — LACTATED RINGERS IV SOLN: INTRAVENOUS | Status: DC

## 2013-12-12 MED ORDER — DIBUCAINE 1 % RE OINT: 1.0000 "application " | TOPICAL_OINTMENT | RECTAL | Status: DC | PRN

## 2013-12-12 MED ORDER — ONDANSETRON HCL 4 MG/2ML IJ SOLN: 4.0000 mg | INTRAMUSCULAR | Status: DC | PRN

## 2013-12-12 MED ORDER — OXYCODONE-ACETAMINOPHEN 5-325 MG PO TABS
1.0000 | ORAL_TABLET | ORAL | Status: DC | PRN
  Filled 2013-12-12: qty 1

## 2013-12-12 MED ORDER — OXYTOCIN 40 UNITS IN LACTATED RINGERS INFUSION - SIMPLE MED
62.5000 mL/h | INTRAVENOUS | Status: DC
  Administered 2013-12-12: 62.5 mL/h via INTRAVENOUS
  Filled 2013-12-12: qty 1000

## 2013-12-12 MED ORDER — WITCH HAZEL-GLYCERIN EX PADS: 1.0000 "application " | MEDICATED_PAD | CUTANEOUS | Status: DC | PRN

## 2013-12-12 MED ORDER — ONDANSETRON HCL 4 MG/2ML IJ SOLN: 4.0000 mg | Freq: Four times a day (QID) | INTRAMUSCULAR | Status: DC | PRN

## 2013-12-12 MED ORDER — FENTANYL 2.5 MCG/ML BUPIVACAINE 1/10 % EPIDURAL INFUSION (WH - ANES)
14.0000 mL/h | INTRAMUSCULAR | Status: DC | PRN
  Administered 2013-12-12: 14 mL/h via EPIDURAL
  Filled 2013-12-12: qty 125

## 2013-12-12 MED ORDER — OXYCODONE-ACETAMINOPHEN 5-325 MG PO TABS: 1.0000 | ORAL_TABLET | ORAL | Status: DC | PRN

## 2013-12-12 MED ORDER — BENZOCAINE-MENTHOL 20-0.5 % EX AERO
1.0000 "application " | INHALATION_SPRAY | CUTANEOUS | Status: DC | PRN
  Administered 2013-12-12: 1 via TOPICAL
  Filled 2013-12-12: qty 56

## 2013-12-12 MED ORDER — OXYTOCIN BOLUS FROM INFUSION: 500.0000 mL | INTRAVENOUS | Status: DC

## 2013-12-12 MED ORDER — LACTATED RINGERS IV SOLN: 500.0000 mL | INTRAVENOUS | Status: DC | PRN

## 2013-12-12 MED ORDER — IBUPROFEN 600 MG PO TABS
600.0000 mg | ORAL_TABLET | Freq: Four times a day (QID) | ORAL | Status: DC
  Administered 2013-12-12 – 2013-12-14 (×8): 600 mg via ORAL
  Filled 2013-12-12 (×8): qty 1

## 2013-12-12 MED ORDER — ZOLPIDEM TARTRATE 5 MG PO TABS: 5.0000 mg | ORAL_TABLET | Freq: Every evening | ORAL | Status: DC | PRN

## 2013-12-12 MED ORDER — DIPHENHYDRAMINE HCL 50 MG/ML IJ SOLN: 12.5000 mg | INTRAMUSCULAR | Status: DC | PRN

## 2013-12-12 MED ORDER — IBUPROFEN 600 MG PO TABS: 600.0000 mg | ORAL_TABLET | Freq: Four times a day (QID) | ORAL | Status: DC | PRN

## 2013-12-12 NOTE — Anesthesia Procedure Notes (Signed)
Epidural Patient location during procedure: OB Start time: 12/12/2013 12:27 PM  Staffing Performed by: anesthesiologist   Preanesthetic Checklist Completed: patient identified, site marked, surgical consent, pre-op evaluation, timeout performed, IV checked, risks and benefits discussed and monitors and equipment checked  Epidural Patient position: sitting Prep: site prepped and draped and DuraPrep Patient monitoring: continuous pulse ox and blood pressure Approach: midline Injection technique: LOR air  Needle:  Needle type: Tuohy  Needle gauge: 17 G Needle length: 9 cm and 9 Needle insertion depth: 5 cm cm Catheter type: closed end flexible Catheter size: 19 Gauge Catheter at skin depth: 10 cm Test dose: negative  Assessment Events: blood not aspirated, injection not painful, no injection resistance, negative IV test and no paresthesia  Additional Notes Discussed risk of headache, infection, bleeding, nerve injury and failed or incomplete block.  Patient voices understanding and wishes to proceed.  Epidural placed easily on first attempt.  No paresthesia.  Patient tolerated procedure well with no apparent complications. Jasmine DecemberA. Kathie Posa, MDReason for block:procedure for pain

## 2013-12-12 NOTE — MAU Note (Signed)
Contractions during the  Night started leaking clear fluid around 1100, had some blood in it.  q 3, feeling pressure.

## 2013-12-12 NOTE — Anesthesia Preprocedure Evaluation (Signed)
Anesthesia Evaluation  Patient identified by MRN, date of birth, ID band Patient awake    Reviewed: Allergy & Precautions, H&P , NPO status , Patient's Chart, lab work & pertinent test results, reviewed documented beta blocker date and time   History of Anesthesia Complications Negative for: history of anesthetic complications  Airway Mallampati: I TM Distance: >3 FB Neck ROM: full    Dental  (+) Teeth Intact   Pulmonary neg pulmonary ROS,  breath sounds clear to auscultation        Cardiovascular negative cardio ROS  Rhythm:regular Rate:Normal     Neuro/Psych negative neurological ROS  negative psych ROS   GI/Hepatic negative GI ROS, Neg liver ROS,   Endo/Other  negative endocrine ROS  Renal/GU negative Renal ROS     Musculoskeletal   Abdominal   Peds  Hematology Thrombocytopenia - plt 141   Anesthesia Other Findings   Reproductive/Obstetrics (+) Pregnancy                           Anesthesia Physical Anesthesia Plan  ASA: II  Anesthesia Plan: Epidural   Post-op Pain Management:    Induction:   Airway Management Planned:   Additional Equipment:   Intra-op Plan:   Post-operative Plan:   Informed Consent: I have reviewed the patients History and Physical, chart, labs and discussed the procedure including the risks, benefits and alternatives for the proposed anesthesia with the patient or authorized representative who has indicated his/her understanding and acceptance.     Plan Discussed with:   Anesthesia Plan Comments:         Anesthesia Quick Evaluation

## 2013-12-12 NOTE — H&P (Signed)
Margaret Leonard is a 28 y.o. female, G3P2002 at 38.3 weeks, presenting for rupture of membranes.  Patient reports that she has been contracting since early this morning.  Recently contractions got more regular and intense.  Patient states around 11am she had a big gush of fluid.  Patient reports active fetus and has bloody show.  Patient GBS positive and requests epidural.  Patient Active Problem List   Diagnosis Date Noted  . Bladder prolapse 01/15/2012  . Thrombocytopenia complicating pregnancy, third trimester 01/15/2012  . Vaginal delivery 12/11/2011  . Supervision of normal pregnancy 12/09/2011  . Second-degree perineal laceration, with delivery 12/09/2011    History of present pregnancy: Patient entered care at 10.3 weeks.   EDC of 12/23/2013 was established by Definite LMP of 03/18/2013.   Anatomy scan:  18.4 weeks, with normal findings and an anterior placenta.   Additional US evaluations:  Growth: 12/09/2013 EFW 6lb 8oz.   Significant prenatal events:  Screened for exposure to parovirus- negative Last evaluation:  12/09/2013 at 38wks  OB History   Grav Para Term Preterm Abortions TAB SAB Ect Mult Living   3 2 2  0 0 0 0 0 0 2     Past Medical History  Diagnosis Date  . Complication of anesthesia   . Urinary tract infection   . Yeast infection   . Bacterial infection   . GBS carrier   . FHx: heart disease   . FHx: asthma   . FHx: cancer   . FHx: hypertension   . FHx: diabetes mellitus   . H/O varicella    Past Surgical History  Procedure Laterality Date  . No past surgeries     Family History: family history includes Birth defects in her mother; Cancer in her paternal grandmother; Diabetes in her maternal grandfather; Heart disease in her paternal grandfather; Hypertension in her maternal grandfather and mother; Other in her cousin. There is no history of Anesthesia problems. Social History:  reports that she has never smoked. She has never used smokeless tobacco. She  reports that she does not drink alcohol or use illicit drugs.   Prenatal Transfer Tool  Maternal Diabetes: No Genetic Screening: Normal Maternal Ultrasounds/Referrals: Normal Fetal Ultrasounds or other Referrals:  None Maternal Substance Abuse:  No Significant Maternal Medications:  None Significant Maternal Lab Results: None    ROS: SEE HPI ABOVE  Allergies  Allergen Reactions  . Ceclor [Cefaclor] Hives  . Penicillins Itching    Face turned red  . Tdap [Diphth-Acell Pertussis-Tetanus] Swelling    Severe leg swelling  . Vancomycin Other (See Comments)    Red man reaction    Chest clear Heart RRR without murmur Abd gravid, NT Pelvic: 4/80/0--Bloody Show Ext: WNL  FHR: 130s bpm, Mod Var, -Decels, +Accels UCs:  Q1-203minutes, moderate to palpation  Prenatal labs: ABO, Rh:  A Antibody:  Positive Rubella:   Negtive RPR:   NR HBsAg:  Negative 05/04/2014  HIV:   Negative GBS:  Positive Sickle cell/Hgb electrophoresis:  N/A Pap:  WNL 03/2012 GC:  Negative  Chlamydia:  Negative Genetic screenings:  Normal Glucola:  Negative Other:  Parovirus-Negative    Assessment IUP at 38.3wks Cat I FT SROM GBS Positive PCN Allergy Active Labor Desires Epidural  [facility] consult with Dr. Kathie RhodesS. Rivard Routine Labor and Delivery Orders Start Clindamycin for GBS prophylaxis Okay for Epidural Anticipate SVD   Joyice FasterJessica Lynn [facility] Surgery Center IncEmlyCNM, MSN 12/12/2013, 11:42 AM

## 2013-12-13 LAB — CBC
HCT: 30.3 % — ABNORMAL LOW (ref 36.0–46.0)
HEMOGLOBIN: 10.2 g/dL — AB (ref 12.0–15.0)
MCH: 30.9 pg (ref 26.0–34.0)
MCHC: 33.7 g/dL (ref 30.0–36.0)
MCV: 91.8 fL (ref 78.0–100.0)
Platelets: 118 10*3/uL — ABNORMAL LOW (ref 150–400)
RBC: 3.3 MIL/uL — ABNORMAL LOW (ref 3.87–5.11)
RDW: 12.6 % (ref 11.5–15.5)
WBC: 13 10*3/uL — ABNORMAL HIGH (ref 4.0–10.5)

## 2013-12-13 NOTE — Lactation Note (Signed)
This note was copied from the chart of Margaret Leonard. Lactation Consultation Note  Patient Name: Margaret Leonard ZOXWR'UToday's Date: 12/13/2013 Reason for consult: Initial assessment LC updated doc flow sheets per mom diary sheet . Per mom baby recently fed for  Total of 12 mins ( 7 mins and 5 mins ) .  Has been acting like he is going to spit and then swallows it. Baby woke up, LC changed wet diaper and greenish mec thinning stool. LC reviewed basics , had mom massage breast ,assisted with hand expressing , large am't of colostrum noted, especially on the left breast. LC also noted baby to have a recessed chin , good mobility of tongue, able to stretch tongue over gum line. Latched , with depth , few sucks and Released and went to sleep. Discussed with mom due to recessed chin it would be helpful for a deep latch to have adequate support and alignment .  Mom aware to call with feeding cues, and is aware of the [facility] and [facility].    Maternal Data Formula Feeding for Exclusion: No Infant to breast within first hour of birth: Yes Has patient been taught Hand Expression?: Yes Does the patient have breastfeeding experience prior to this delivery?: Yes  Feeding Feeding Type: Breast Fed Length of feed: 5 min (per mom )  LATCH Score/Interventions Latch: Repeated attempts needed to sustain latch, nipple held in mouth throughout feeding, stimulation needed to elicit sucking reflex. (breast massage, hand express prior to attempt ) Intervention(s): Adjust position;Assist with latch;Breast massage;Breast compression  Audible Swallowing: None Intervention(s): Skin to skin  Type of Nipple: Everted at rest and after stimulation  Comfort (Breast/Nipple): Soft / non-tender     Hold (Positioning): Assistance needed to correctly position infant at breast and maintain latch. Intervention(s): Breastfeeding basics reviewed;Support Pillows;Position options;Skin to skin (baby sleepy , and noted  a recessed chin )  LATCH Score: 6  Lactation Tools Discussed/Used WIC Program: No Date initiated:: 12/13/13   Consult Status Consult Status: Follow-up (mom to page with feeding cues ) Date: 12/13/13 Follow-up type: In-patient    Margaret Leonard 12/13/2013, 5:42 PM

## 2013-12-13 NOTE — Anesthesia Postprocedure Evaluation (Signed)
  Anesthesia Post-op Note  Patient: Margaret Leonard  Procedure(s) Performed: * No procedures listed *  Patient Location: Mother/Baby  Anesthesia Type:Epidural  Level of Consciousness: awake, alert , oriented and patient cooperative  Airway and Oxygen Therapy: Patient Spontanous Breathing  Post-op Pain: mild  Post-op Assessment: Patient's Cardiovascular Status Stable, Respiratory Function Stable, No headache, No backache, No residual numbness and No residual motor weakness  Post-op Vital Signs: stable  Last Vitals:  Filed Vitals:   12/13/13 0430  BP: 102/66  Pulse: 78  Temp: 36.7 C  Resp: 18    Complications: No apparent anesthesia complications

## 2013-12-13 NOTE — Progress Notes (Signed)
Margaret NeighborAlison D Leonard  Post Partum Day 1:S/P SVD with 2nd degree perineal  Subjective: Patient up ad lib, denies syncope or dizziness. Denies N/V and no issues with regular diet Feeding:  Breast Contraceptive plan: Condoms, Unsure  Objective: Blood pressure 102/66, pulse 78, temperature 98.1 F (36.7 C), temperature source Oral, resp. rate 18, height 5\' 4"  (1.626 m), weight 131 lb (59.421 kg), SpO2 100.00%, unknown if currently breastfeeding.  Physical Exam:  General: alert, cooperative and no distress Lochia: appropriate Uterine Fundus: firm Incision: N/A DVT Evaluation: No evidence of DVT seen on physical exam. Negative Homan's sign.   Recent Labs  12/12/13 1155 12/13/13 0545  HGB 12.3 10.2*  HCT 35.6* 30.3*    Assessment S/P SVD Normal Involution Breastfeeding  Plan: Continue current care Will consider birth control options in addition to condoms Plan to discharge tomorrow Desires outpatient circ Dr. Kathie RhodesS. Rivard to be updated on patient status   Margaret BastJessica Lynn Jaylena Holloway, CNM 12/13/2013, 8:43 AM

## 2013-12-14 NOTE — Discharge Summary (Signed)
Vaginal Delivery Discharge Summary  Margaret Leonard  DOB:    October 10, 1985 MRN:    [id number] CSN:    [id number]  Date of admission:                  12/12/2013  Date of discharge:                   12/14/2013   Procedures this admission: SVD with 2nd degree perineal laceration  Date of Delivery: 12/12/2013  Newborn Data:  Live born female  Birth Weight: 6 lb 14.1 oz (3120 g) APGAR: 8, 9  Home with mother. Name: Margaret Leonard Circumcision Plan: Outpatient  History of Present Illness:  Margaret Leonard is a 28 y.o. female, G3P3003, who presents at [redacted]w[redacted]d weeks gestation. The patient has been followed at the [facility] and Gynecology division of [facility]. She was admitted onset of labor and rupture of membranes. Her pregnancy has been complicated by: none.  Hospital course:  The patient was admitted for rupture of membranes.   Her labor was not complicated. She proceeded to have a vaginal delivery of a healthy infant. Her delivery was performed by Gerrit Heck, CNM and was not complicated. Her postpartum course was not complicated.  She was discharged to home on postpartum day 2 doing well.  Feeding:  breast  Contraception:  condoms   Discharge hemoglobin:  Hemoglobin  Date Value Ref Range Status  12/13/2013 10.2* 12.0 - 15.0 g/dL Final     DELTA CHECK NOTED     REPEATED TO VERIFY     HCT  Date Value Ref Range Status  12/13/2013 30.3* 36.0 - 46.0 % Final    Discharge Physical Exam:   General: alert, cooperative and no distress Lochia: appropriate Uterine Fundus: firm, U/-3 Incision:N/A DVT Evaluation: No evidence of DVT seen on physical exam. Negative Homan's sign.  Intrapartum Procedures: spontaneous vaginal delivery Postpartum Procedures: none Complications-Operative and Postpartum: 2nd degree perineal laceration  Discharge Diagnoses: Term Pregnancy-delivered  Discharge Information:  Activity:           pelvic  rest Diet:                routine Medications: None Condition:      stable Instructions:   Postpartum Care After Vaginal Delivery  After you deliver your newborn (postpartum period), the usual stay in the hospital is 24 72 hours. If there were problems with your labor or delivery, or if you have other medical problems, you might be in the hospital longer.  While you are in the hospital, you will receive help and instructions on how to care for yourself and your newborn during the postpartum period.  While you are in the hospital:  Be sure to tell your nurses if you have pain or discomfort, as well as where you feel the pain and what makes the pain worse.  If you had an incision made near your vagina (episiotomy) or if you had some tearing during delivery, the nurses may put ice packs on your episiotomy or tear. The ice packs may help to reduce the pain and swelling.  If you are breastfeeding, you may feel uncomfortable contractions of your uterus for a couple of weeks. This is normal. The contractions help your uterus get back to normal size.  It is normal to have some bleeding after delivery.  For the first 1 3 days after delivery, the flow is red and the amount may be  similar to a period.  It is common for the flow to start and stop.  In the first few days, you may pass some small clots. Let your nurses know if you begin to pass large clots or your flow increases.  Do not  flush blood clots down the toilet before having the nurse look at them.  During the next 3 10 days after delivery, your flow should become more watery and pink or brown-tinged in color.  Ten to fourteen days after delivery, your flow should be a small amount of yellowish-white discharge.  The amount of your flow will decrease over the first few weeks after delivery. Your flow may stop in 6 8 weeks. Most women have had their flow stop by 12 weeks after delivery.  You should change your sanitary pads  frequently.  Wash your hands thoroughly with soap and water for at least 20 seconds after changing pads, using the toilet, or before holding or feeding your newborn.  You should feel like you need to empty your bladder within the first 6 8 hours after delivery.  In case you become weak, lightheaded, or faint, call your nurse before you get out of bed for the first time and before you take a shower for the first time.  Within the first few days after delivery, your breasts may begin to feel tender and full. This is called engorgement. Breast tenderness usually goes away within 48 72 hours after engorgement occurs. You may also notice milk leaking from your breasts. If you are not breastfeeding, do not stimulate your breasts. Breast stimulation can make your breasts produce more milk.  Spending as much time as possible with your newborn is very important. During this time, you and your newborn can feel close and get to know each other. Having your newborn stay in your room (rooming in) will help to strengthen the bond with your newborn. It will give you time to get to know your newborn and become comfortable caring for your newborn.  Your hormones change after delivery. Sometimes the hormone changes can temporarily cause you to feel sad or tearful. These feelings should not last more than a few days. If these feelings last longer than that, you should talk to your caregiver.  If desired, talk to your caregiver about methods of family planning or contraception.  Talk to your caregiver about immunizations. Your caregiver may want you to have the following immunizations before leaving the hospital:  Tetanus, diphtheria, and pertussis (Tdap) or tetanus and diphtheria (Td) immunization. It is very important that you and your family (including grandparents) or others caring for your newborn are up-to-date with the Tdap or Td immunizations. The Tdap or Td immunization can help protect your newborn from  getting ill.  Rubella immunization.  Varicella (chickenpox) immunization.  Influenza immunization. You should receive this annual immunization if you did not receive the immunization during your pregnancy. Document Released: 06/15/2007 Document Revised: 05/12/2012 Document Reviewed: 04/14/2012 [facility] Patient Information 2014 [employer] [place].   Postpartum Depression and Baby Blues  The postpartum period begins right after the birth of a baby. During this time, there is often a great amount of joy and excitement. It is also a time of considerable changes in the life of the parent(s). Regardless of how many times a mother gives birth, each child brings new challenges and dynamics to the family. It is not unusual to have feelings of excitement accompanied by confusing shifts in moods, emotions, and thoughts. All mothers are  at risk of developing postpartum depression or the "baby blues." These mood changes can occur right after giving birth, or they may occur many months after giving birth. The baby blues or postpartum depression can be mild or severe. Additionally, postpartum depression can resolve rather quickly, or it can be a long-term condition. CAUSES Elevated hormones and their rapid decline are thought to be a main cause of postpartum depression and the baby blues. There are a number of hormones that radically change during and after pregnancy. Estrogen and progesterone usually decrease immediately after delivering your baby. The level of thyroid hormone and various cortisol steroids also rapidly drop. Other factors that play a major role in these changes include major life events and genetics.  RISK FACTORS If you have any of the following risks for the baby blues or postpartum depression, know what symptoms to watch out for during the postpartum period. Risk factors that may increase the likelihood of getting the baby blues or postpartum depression include:  Havinga personal or family  history of depression.  Having depression while being pregnant.  Having premenstrual or oral contraceptive-associated mood issues.  Having exceptional life stress.  Having marital conflict.  Lacking a social support network.  Having a baby with special needs.  Having health problems such as diabetes. SYMPTOMS Baby blues symptoms include:  Brief fluctuations in mood, such as going from extreme happiness to sadness.  Decreased concentration.  Difficulty sleeping.  Crying spells, tearfulness.  Irritability.  Anxiety. Postpartum depression symptoms typically begin within the first month after giving birth. These symptoms include:  Difficulty sleeping or excessive sleepiness.  Marked weight loss.  Agitation.  Feelings of worthlessness.  Lack of interest in activity or food. Postpartum psychosis is a very concerning condition and can be dangerous. Fortunately, it is rare. Displaying any of the following symptoms is cause for immediate medical attention. Postpartum psychosis symptoms include:  Hallucinations and delusions.  Bizarre or disorganized behavior.  Confusion or disorientation. DIAGNOSIS  A diagnosis is made by an evaluation of your symptoms. There are no medical or lab tests that lead to a diagnosis, but there are various questionnaires that a caregiver may use to identify those with the baby blues, postpartum depression, or psychosis. Often times, a screening tool called the [place] Postnatal Depression Scale is used to diagnose depression in the postpartum period.  TREATMENT The baby blues usually goes away on its own in 1 to 2 weeks. Social support is often all that is needed. You should be encouraged to get adequate sleep and rest. Occasionally, you may be given medicines to help you sleep.  Postpartum depression requires treatment as it can last several months or longer if it is not treated. Treatment may include individual or group therapy, medicine, or  both to address any social, physiological, and psychological factors that may play a role in the depression. Regular exercise, a healthy diet, rest, and social support may also be strongly recommended.  Postpartum psychosis is more serious and needs treatment right away. Hospitalization is often needed. HOME CARE INSTRUCTIONS  Get as much rest as you can. Nap when the baby sleeps.  Exercise regularly. Some women find yoga and walking to be beneficial.  Eat a balanced and nourishing diet.  Do little things that you enjoy. Have a cup of tea, take a bubble bath, read your favorite magazine, or listen to your favorite music.  Avoid alcohol.  Ask for help with household chores, cooking, grocery shopping, or running errands as needed. Do  not try to do everything.  Talk to people close to you about how you are feeling. Get support from your partner, family members, friends, or other new moms.  Try to stay positive in how you think. Think about the things you are grateful for.  Do not spend a lot of time alone.  Only take medicine as directed by your caregiver.  Keep all your postpartum appointments.  Let your caregiver know if you have any concerns. SEEK MEDICAL CARE IF: You are having a reaction or problems with your medicine. SEEK IMMEDIATE MEDICAL CARE IF:  You have suicidal feelings.  You feel you may harm the baby or someone else. Document Released: 05/22/2004 Document Revised: 11/10/2011 Document Reviewed: 06/24/2011 [facility] Patient Information 2014 [place], [place].   Discharge to: home  Follow-up Information   Schedule an appointment as soon as possible for a visit with [facility]. (Please call if you have any questions or concerns prior to you next visit. )    Specialty:  Obstetrics and Gynecology   Contact information:   [address] [place] [address] [phone]       Gerrit Heck , CNM, MSN   12/14/2013

## 2013-12-14 NOTE — Lactation Note (Signed)
This note was copied from the chart of Margaret Neil Crouchlison Starrett. Lactation Consultation Note  Patient Name: Margaret Leonard ZOXWR'UToday's Date: 12/14/2013 Reason for consult: Follow-up assessment Mom called for LC to observe latch. Mom started in football hold, however LC assisted Mom with cross cradle to obtain more depth. Baby demonstrates a good rhythmic suck with stimulation, some swallows noted, however is still sleepy at the breast. Baby nursed off and on for 15 minutes at this feeding. LC demonstrated how to supplement with foley cup and curved tipped syringe. Baby took approx 2 ml of EBM but then asleep. Reviewed with Mom signs/symptoms of infection, such as not waking to eat, temperature instability and advised Mom these are things to monitor when home. Mom was GBS positive and delivered too fast for treatment. Report called to Dr. Margo AyeHall. Advised by Dr. Margo AyeHall that she would like LC to observe another feeding to see if baby will be more vigorous at the breast and more interested in feeding. Mom advised to call LC with next feeding.   Maternal Data    Feeding Feeding Type: Breast Milk Length of feed: 15 min  LATCH Score/Interventions Latch: Repeated attempts needed to sustain latch, nipple held in mouth throughout feeding, stimulation needed to elicit sucking reflex. Intervention(s): Adjust position;Assist with latch;Breast massage;Breast compression  Audible Swallowing: A few with stimulation  Type of Nipple: Everted at rest and after stimulation  Comfort (Breast/Nipple): Soft / non-tender     Hold (Positioning): Assistance needed to correctly position infant at breast and maintain latch. Intervention(s): Breastfeeding basics reviewed;Support Pillows;Position options;Skin to skin  LATCH Score: 7  Lactation Tools Discussed/Used Tools: Pump Shell Type: Inverted Breast pump type: Manual   Consult Status Consult Status: Follow-up Date: 12/14/13 Follow-up type: In-patient    Margaret HardKathy Ann  [name] 12/14/2013, 1:23 PM

## 2013-12-14 NOTE — Lactation Note (Addendum)
This note was copied from the chart of Margaret Neil Crouchlison Nehme. Lactation Consultation Note  Patient Name: Margaret Neil Crouchlison Giaimo WUXLK'GToday's Date: 12/14/2013 Reason for consult: Follow-up assessment Mom reports she feels baby is nursing well, she reports hearing some swallows with baby at the breast, stools are starting to change to green and her breasts are beginning to fill, Mom pumped approx 15 ml this am. Breasts soft at this visit.  Some mild nipple tenderness reported but Mom reports this is improving with using the football hold.  Engorgement care reviewed if needed. Baby gave some feeding ques and Mom latched baby in football hold. Baby sleepy at the breast but with stimulation demonstrated a regular suckling rhythm with swallows noted, no nipple pain reported.  Left baby STS and advised Mom to call with next feeding. Advised Mom of OP services and support group.    Maternal Data    Feeding Feeding Type: Breast Fed Length of feed: 0 min  LATCH Score/Interventions                      Lactation Tools Discussed/Used Tools: Pump;Shells Shell Type: Inverted Breast pump type: Manual   Consult Status Consult Status: Follow-up Date: 12/14/13 Follow-up type: In-patient    Margaret Leonard 12/14/2013, 11:05 AM

## 2013-12-14 NOTE — Discharge Instructions (Signed)
Intrauterine Device Information An intrauterine device (IUD) is inserted into your uterus to prevent pregnancy. There are two types of IUDs available:   Copper IUD This type of IUD is wrapped in copper wire and is placed inside the uterus. Copper makes the uterus and fallopian tubes produce a fluid that kills sperm. The copper IUD can stay in place for 10 years.  Hormone IUD This type of IUD contains the hormone progestin (synthetic progesterone). The hormone thickens the cervical mucus and prevents sperm from entering the uterus. It also thins the uterine lining to prevent implantation of a fertilized egg. The hormone can weaken or kill the sperm that get into the uterus. One type of hormone IUD can stay in place for 5 years, and another type can stay in place for 3 years. Your health care provider will make sure you are a good candidate for a contraceptive IUD. Discuss with your health care provider the possible side effects.  ADVANTAGES OF AN INTRAUTERINE DEVICE  IUDs are highly effective, reversible, long acting, and low maintenance.   There are no estrogen-related side effects.   An IUD can be used when breastfeeding.   IUDs are not associated with weight gain.   The copper IUD works immediately after insertion.   The hormone IUD works right away if inserted within 7 days of your period starting. You will need to use a backup method of birth control for 7 days if the hormone IUD is inserted at any other time in your cycle.  The copper IUD does not interfere with your female hormones.   The hormone IUD can make heavy menstrual periods lighter and decrease cramping.   The hormone IUD can be used for 3 or 5 years.   The copper IUD can be used for 10 years. DISADVANTAGES OF AN INTRAUTERINE DEVICE  The hormone IUD can be associated with irregular bleeding patterns.   The copper IUD can make your menstrual flow heavier and more painful.   You may experience cramping and  vaginal bleeding after insertion.  Document Released: 07/22/2004 Document Revised: 04/20/2013 Document Reviewed: 02/06/2013 [facility] Patient Information 2014 [employer] [place], [place].  Postpartum Depression and Baby Blues The postpartum period begins right after the birth of a baby. During this time, there is often a great amount of joy and excitement. It is also a time of considerable changes in the life of the parent(s). Regardless of how many times a mother gives birth, each child brings new challenges and dynamics to the family. It is not unusual to have feelings of excitement accompanied by confusing shifts in moods, emotions, and thoughts. All mothers are at risk of developing postpartum depression or the "baby blues." These mood changes can occur right after giving birth, or they may occur many months after giving birth. The baby blues or postpartum depression can be mild or severe. Additionally, postpartum depression can resolve rather quickly, or it can be a long-term condition. CAUSES Elevated hormones and their rapid decline are thought to be a main cause of postpartum depression and the baby blues. There are a number of hormones that radically change during and after pregnancy. Estrogen and progesterone usually decrease immediately after delivering your baby. The level of thyroid hormone and various cortisol steroids also rapidly drop. Other factors that play a major role in these changes include major life events and genetics.  RISK FACTORS If you have any of the following risks for the baby blues or postpartum depression, know what symptoms to watch out  for during the postpartum period. Risk factors that may increase the likelihood of getting the baby blues or postpartum depression include:  Havinga personal or family history of depression.  Having depression while being pregnant.  Having premenstrual or oral contraceptive-associated mood issues.  Having exceptional life stress.  Having  marital conflict.  Lacking a social support network.  Having a baby with special needs.  Having health problems such as diabetes. SYMPTOMS Baby blues symptoms include:  Brief fluctuations in mood, such as going from extreme happiness to sadness.  Decreased concentration.  Difficulty sleeping.  Crying spells, tearfulness.  Irritability.  Anxiety. Postpartum depression symptoms typically begin within the first month after giving birth. These symptoms include:  Difficulty sleeping or excessive sleepiness.  Marked weight loss.  Agitation.  Feelings of worthlessness.  Lack of interest in activity or food. Postpartum psychosis is a very concerning condition and can be dangerous. Fortunately, it is rare. Displaying any of the following symptoms is cause for immediate medical attention. Postpartum psychosis symptoms include:  Hallucinations and delusions.  Bizarre or disorganized behavior.  Confusion or disorientation. DIAGNOSIS  A diagnosis is made by an evaluation of your symptoms. There are no medical or lab tests that lead to a diagnosis, but there are various questionnaires that a caregiver may use to identify those with the baby blues, postpartum depression, or psychosis. Often times, a screening tool called the [place] Postnatal Depression Scale is used to diagnose depression in the postpartum period.  TREATMENT The baby blues usually goes away on its own in 1 to 2 weeks. Social support is often all that is needed. You should be encouraged to get adequate sleep and rest. Occasionally, you may be given medicines to help you sleep.  Postpartum depression requires treatment as it can last several months or longer if it is not treated. Treatment may include individual or group therapy, medicine, or both to address any social, physiological, and psychological factors that may play a role in the depression. Regular exercise, a healthy diet, rest, and social support may also be  strongly recommended.  Postpartum psychosis is more serious and needs treatment right away. Hospitalization is often needed. HOME CARE INSTRUCTIONS  Get as much rest as you can. Nap when the baby sleeps.  Exercise regularly. Some women find yoga and walking to be beneficial.  Eat a balanced and nourishing diet.  Do little things that you enjoy. Have a cup of tea, take a bubble bath, read your favorite magazine, or listen to your favorite music.  Avoid alcohol.  Ask for help with household chores, cooking, grocery shopping, or running errands as needed. Do not try to do everything.  Talk to people close to you about how you are feeling. Get support from your partner, family members, friends, or other new moms.  Try to stay positive in how you think. Think about the things you are grateful for.  Do not spend a lot of time alone.  Only take medicine as directed by your caregiver.  Keep all your postpartum appointments.  Let your caregiver know if you have any concerns. SEEK MEDICAL CARE IF: You are having a reaction or problems with your medicine. SEEK IMMEDIATE MEDICAL CARE IF:  You have suicidal feelings.  You feel you may harm the baby or someone else. Document Released: 05/22/2004 Document Revised: 11/10/2011 Document Reviewed: 05/30/2013 [facility] Patient Information 2014 [place], [place].

## 2014-07-03 ENCOUNTER — Encounter (HOSPITAL_COMMUNITY): Payer: Self-pay | Admitting: *Deleted

## 2017-04-23 LAB — OB RESULTS CONSOLE GC/CHLAMYDIA
Chlamydia: NEGATIVE
GC PROBE AMP, GENITAL: NEGATIVE

## 2017-04-23 LAB — OB RESULTS CONSOLE RUBELLA ANTIBODY, IGM: Rubella: IMMUNE

## 2017-04-23 LAB — OB RESULTS CONSOLE HEPATITIS B SURFACE ANTIGEN: Hepatitis B Surface Ag: NEGATIVE

## 2017-04-23 LAB — OB RESULTS CONSOLE RPR: RPR: NONREACTIVE

## 2017-09-01 NOTE — L&D Delivery Note (Signed)
Delivery Note At  a viable female was delivered via  (Presentation:vtx ;LOA  ).  APGAR:8 ,8 ; weight pending  .   Placenta status:complete , .  3V Cord:  with the following complications:None .   Anesthesia:  Local Episiotomy:  N/A Lacerations:  2nd Suture Repair: 2.0 vicryl Est. Blood Loss (mL):  100  Mom to postpartum.  Baby to Couplet care / Skin to Skin.  Margaret Leonard 12/09/2017, 7:52 PM

## 2017-09-15 LAB — OB RESULTS CONSOLE HIV ANTIBODY (ROUTINE TESTING): HIV: NONREACTIVE

## 2017-12-09 ENCOUNTER — Encounter (HOSPITAL_COMMUNITY): Payer: Self-pay

## 2017-12-09 ENCOUNTER — Other Ambulatory Visit: Payer: Self-pay

## 2017-12-09 ENCOUNTER — Inpatient Hospital Stay (HOSPITAL_COMMUNITY)
Admission: AD | Admit: 2017-12-09 | Discharge: 2017-12-11 | DRG: 807 | Disposition: A | Discharge status: Home / Self Care | Payer: BLUE CROSS/BLUE SHIELD | Source: Ambulatory Visit | Attending: Obstetrics and Gynecology | Admitting: Obstetrics and Gynecology

## 2017-12-09 DIAGNOSIS — Z880 Allergy status to penicillin: Secondary | ICD-10-CM

## 2017-12-09 DIAGNOSIS — O4290 Premature rupture of membranes, unspecified as to length of time between rupture and onset of labor, unspecified weeks of gestation: Secondary | ICD-10-CM | POA: Diagnosis present

## 2017-12-09 DIAGNOSIS — O99824 Streptococcus B carrier state complicating childbirth: Secondary | ICD-10-CM | POA: Diagnosis present

## 2017-12-09 DIAGNOSIS — O4292 Full-term premature rupture of membranes, unspecified as to length of time between rupture and onset of labor: Secondary | ICD-10-CM | POA: Diagnosis present

## 2017-12-09 DIAGNOSIS — Z3A39 39 weeks gestation of pregnancy: Secondary | ICD-10-CM | POA: Diagnosis not present

## 2017-12-09 LAB — CBC
HEMATOCRIT: 36.2 % (ref 36.0–46.0)
HEMOGLOBIN: 12.6 g/dL (ref 12.0–15.0)
MCH: 32.8 pg (ref 26.0–34.0)
MCHC: 34.8 g/dL (ref 30.0–36.0)
MCV: 94.3 fL (ref 78.0–100.0)
Platelets: 164 10*3/uL (ref 150–400)
RBC: 3.84 MIL/uL — ABNORMAL LOW (ref 3.87–5.11)
RDW: 13.3 % (ref 11.5–15.5)
WBC: 9.7 10*3/uL (ref 4.0–10.5)

## 2017-12-09 LAB — TYPE AND SCREEN
ABO/RH(D): A POS
Antibody Screen: NEGATIVE

## 2017-12-09 LAB — ABO/RH: ABO/RH(D): A POS

## 2017-12-09 MED ORDER — OXYTOCIN BOLUS FROM INFUSION
500.0000 mL | Freq: Once | INTRAVENOUS | Status: AC
  Administered 2017-12-09: 500 mL via INTRAVENOUS

## 2017-12-09 MED ORDER — IBUPROFEN 600 MG PO TABS
600.0000 mg | ORAL_TABLET | Freq: Four times a day (QID) | ORAL | Status: DC
  Administered 2017-12-10 – 2017-12-11 (×6): 600 mg via ORAL
  Filled 2017-12-09 (×7): qty 1

## 2017-12-09 MED ORDER — LACTATED RINGERS IV SOLN: 500.0000 mL | INTRAVENOUS | Status: DC | PRN

## 2017-12-09 MED ORDER — WITCH HAZEL-GLYCERIN EX PADS: 1.0000 "application " | MEDICATED_PAD | CUTANEOUS | Status: DC | PRN

## 2017-12-09 MED ORDER — ACETAMINOPHEN 325 MG PO TABS
650.0000 mg | ORAL_TABLET | ORAL | Status: DC | PRN
  Administered 2017-12-10 (×2): 650 mg via ORAL
  Filled 2017-12-09 (×2): qty 2

## 2017-12-09 MED ORDER — OXYCODONE-ACETAMINOPHEN 5-325 MG PO TABS: 2.0000 | ORAL_TABLET | ORAL | Status: DC | PRN

## 2017-12-09 MED ORDER — OXYTOCIN 40 UNITS IN LACTATED RINGERS INFUSION - SIMPLE MED
2.5000 [IU]/h | INTRAVENOUS | Status: DC
  Administered 2017-12-09: 2.5 [IU]/h via INTRAVENOUS

## 2017-12-09 MED ORDER — SIMETHICONE 80 MG PO CHEW: 80.0000 mg | CHEWABLE_TABLET | ORAL | Status: DC | PRN

## 2017-12-09 MED ORDER — DIBUCAINE 1 % RE OINT: 1.0000 "application " | TOPICAL_OINTMENT | RECTAL | Status: DC | PRN

## 2017-12-09 MED ORDER — MISOPROSTOL 25 MCG QUARTER TABLET
25.0000 ug | ORAL_TABLET | ORAL | Status: DC
  Administered 2017-12-09: 25 ug via ORAL
  Filled 2017-12-09: qty 1

## 2017-12-09 MED ORDER — SODIUM CHLORIDE 0.9 % IV SOLN
5.0000 10*6.[IU] | Freq: Once | INTRAVENOUS | Status: AC
  Administered 2017-12-09: 5 10*6.[IU] via INTRAVENOUS
  Filled 2017-12-09: qty 5

## 2017-12-09 MED ORDER — PENICILLIN G POT IN DEXTROSE 60000 UNIT/ML IV SOLN
3.0000 10*6.[IU] | INTRAVENOUS | Status: DC
  Administered 2017-12-09: 3 10*6.[IU] via INTRAVENOUS
  Filled 2017-12-09 (×3): qty 50

## 2017-12-09 MED ORDER — ZOLPIDEM TARTRATE 5 MG PO TABS: 5.0000 mg | ORAL_TABLET | Freq: Every evening | ORAL | Status: DC | PRN

## 2017-12-09 MED ORDER — ONDANSETRON HCL 4 MG/2ML IJ SOLN: 4.0000 mg | Freq: Four times a day (QID) | INTRAMUSCULAR | Status: DC | PRN

## 2017-12-09 MED ORDER — BENZOCAINE-MENTHOL 20-0.5 % EX AERO: 1.0000 "application " | INHALATION_SPRAY | CUTANEOUS | Status: DC | PRN

## 2017-12-09 MED ORDER — TERBUTALINE SULFATE 1 MG/ML IJ SOLN
0.2500 mg | Freq: Once | INTRAMUSCULAR | Status: DC | PRN
  Filled 2017-12-09: qty 1

## 2017-12-09 MED ORDER — ACETAMINOPHEN 325 MG PO TABS: 650.0000 mg | ORAL_TABLET | ORAL | Status: DC | PRN

## 2017-12-09 MED ORDER — DIPHENHYDRAMINE HCL 25 MG PO CAPS: 25.0000 mg | ORAL_CAPSULE | Freq: Four times a day (QID) | ORAL | Status: DC | PRN

## 2017-12-09 MED ORDER — ONDANSETRON HCL 4 MG/2ML IJ SOLN: 4.0000 mg | INTRAMUSCULAR | Status: DC | PRN

## 2017-12-09 MED ORDER — SOD CITRATE-CITRIC ACID 500-334 MG/5ML PO SOLN: 30.0000 mL | ORAL | Status: DC | PRN

## 2017-12-09 MED ORDER — SENNOSIDES-DOCUSATE SODIUM 8.6-50 MG PO TABS
2.0000 | ORAL_TABLET | ORAL | Status: DC
  Administered 2017-12-10 – 2017-12-11 (×2): 2 via ORAL
  Filled 2017-12-09 (×2): qty 2

## 2017-12-09 MED ORDER — OXYCODONE-ACETAMINOPHEN 5-325 MG PO TABS: 1.0000 | ORAL_TABLET | ORAL | Status: DC | PRN

## 2017-12-09 MED ORDER — PRENATAL MULTIVITAMIN CH
1.0000 | ORAL_TABLET | Freq: Every day | ORAL | Status: DC
  Administered 2017-12-10: 1 via ORAL
  Filled 2017-12-09: qty 1

## 2017-12-09 MED ORDER — FLEET ENEMA 7-19 GM/118ML RE ENEM: 1.0000 | ENEMA | RECTAL | Status: DC | PRN

## 2017-12-09 MED ORDER — LACTATED RINGERS IV SOLN
INTRAVENOUS | Status: DC
  Administered 2017-12-09 (×2): via INTRAVENOUS

## 2017-12-09 MED ORDER — OXYTOCIN 40 UNITS IN LACTATED RINGERS INFUSION - SIMPLE MED
1.0000 m[IU]/min | INTRAVENOUS | Status: DC
  Administered 2017-12-09: 1 m[IU]/min via INTRAVENOUS
  Administered 2017-12-09: 3 m[IU]/min via INTRAVENOUS
  Filled 2017-12-09: qty 1000

## 2017-12-09 MED ORDER — FENTANYL CITRATE (PF) 100 MCG/2ML IJ SOLN
100.0000 ug | INTRAMUSCULAR | Status: DC | PRN
  Administered 2017-12-09: 100 ug via INTRAVENOUS
  Filled 2017-12-09: qty 2

## 2017-12-09 MED ORDER — ONDANSETRON HCL 4 MG PO TABS: 4.0000 mg | ORAL_TABLET | ORAL | Status: DC | PRN

## 2017-12-09 MED ORDER — COCONUT OIL OIL: 1.0000 "application " | TOPICAL_OIL | Status: DC | PRN

## 2017-12-09 MED ORDER — LIDOCAINE HCL (PF) 1 % IJ SOLN
30.0000 mL | INTRAMUSCULAR | Status: DC | PRN
  Administered 2017-12-09: 30 mL via SUBCUTANEOUS
  Filled 2017-12-09: qty 30

## 2017-12-09 NOTE — Progress Notes (Signed)
Margaret Leonard is a 32 y.o. [phone] at 4113w1d   Subjective: I went to speak with pt about half hour ago about starting pitocin instead of another dose of cytotec and pt is agreeable.  Objective: BP 110/68 (BP Location: Right Arm)   Pulse 76   Temp 98.2 F (36.8 C) (Oral)   Resp 20   Ht 5\' 4"  (1.626 m)   Wt 133 lb (60.3 kg)   LMP 02/08/2017 (Exact Date)   BMI 22.83 kg/m  No intake/output data recorded. No intake/output data recorded.  FHT:  FHR: 130 bpm, variability: moderate,  accelerations:  Present,  decelerations:  Absent UC:   irregular, every 5-9 minutes SVE:   Dilation: 4 Effacement (%): 70 Station: -1 Exam by:: Margaret g jones RN   Labs: Lab Results  Component Value Date   WBC 9.7 12/09/2017   HGB 12.6 12/09/2017   HCT 36.2 12/09/2017   MCV 94.3 12/09/2017   PLT 164 12/09/2017    Assessment / Plan: s/p cytotec with irregular contractions.  Will start pitocin instead of another dose of cytotec.  Labor: will augment with pitocin Preeclampsia:  no signs or symptoms of toxicity Fetal Wellbeing:  Category I Pain Control:  pain medicine upon request I/D:  GBS + on PCN without any reaction and now pt is 2hrs s/p 1st dose Anticipated MOD:  NSVD  Purcell Nailsngela Y Tom Ragsdale 12/09/2017, 5:02 PM

## 2017-12-09 NOTE — H&P (Addendum)
Jamison Neighborlison D Stagner is a 32 y.o. female presenting for direct admission.  Pt is being sent from office after seeing Nigel BridgemanVicki Latham, CNM who reports SROM at 10:30pm last night (reportedly clear fluid) and GBS + (ceclor, pcn and RMS from vanc allergies) and minimal ctxs.  She discussed augmentation with po cytotec vs nipple stimulation.  RN called me upon pts arrival stating pt is ok with nipple stimulation or po cytotec.  No VB and good FM. _  OB History    Gravida  4   Para  3   Term  3   Preterm  0   AB  0   Living  3     SAB  0   TAB  0   Ectopic  0   Multiple  0   Live Births  3          Past Medical History:  Diagnosis Date  . Bacterial infection   . Female bladder prolapse   . FHx: asthma   . FHx: cancer   . FHx: diabetes mellitus   . FHx: heart disease   . FHx: hypertension   . GBS carrier   . H/O varicella   . Infection    UTI  . Urinary tract infection   . Yeast infection    Past Surgical History:  Procedure Laterality Date  . WISDOM TOOTH EXTRACTION     Family History: family history includes Birth defects in her mother; Cancer in her paternal grandmother; Diabetes in her maternal grandfather; Heart disease in her paternal grandfather; Hypertension in her maternal grandfather and mother; Other in her cousin. Social History:  reports that she has never smoked. She has never used smokeless tobacco. She reports that she does not drink alcohol or use drugs.     Maternal Diabetes: No Genetic Screening: Normal Maternal Ultrasounds/Referrals: Normal Fetal Ultrasounds or other Referrals:  None Maternal Substance Abuse:  No Significant Maternal Medications:  None Significant Maternal Lab Results:  Lab values include: Group B Strep positive Other Comments:  Dr. Thalia BloodgoodNeuman of [facility]FM recommended levaquin in labor for GBS prophylaxis documented in a Note from Dr. Normand Sloopillard in prenatal record dated 11/27/17  ROS  Denies F/C/N/V/D  History   Blood pressure 102/78, pulse  85, resp. rate 20, height 5\' 4"  (1.626 m), weight 133 lb (60.3 kg), last menstrual period 02/08/2017, unknown if currently breastfeeding. Exam Physical Exam  Lungs CTA CV RRR Abd gravid, NT Ext no calf tenderness FHT 135, mod variability, +accels, no decels Toco irregular and infrequent  Prenatal labs: ABO, Rh:  A + Antibody:  Negative Rubella:  Immune RPR:  NR  HBsAg:   NR HIV:   NR GBS:   Positive  Assessment/Plan: P3 at 39 1/7 wks with SROM and not in labor.  Pt is agreeable to cytotec by mouth but would like to avoid pitocin, will start po cytotec now.  Cat 1 and reactive tracing.  Pain medicine upon request.  Levaquin per pharmacy consult for GBS prophylaxis per Dr. Thalia BloodgoodNeuman d/t adverse reactions to numerous meds.   Purcell Nailsngela Y Calea Hribar 12/09/2017, 11:58 AM  Lengthy conversation about pt's allergy history with Florentina AddisonKatie, RN and her husband present.  Pt insists she had amoxicillin multiple times as a child d/t strep throat and never had any problems taking it.  She doesn't remember taking ceclor so she believes it must have been before [age]yrs old.  She even states she thinks she may have taken pcn before with no problem but  she isn't 100% certain.  PCN allergy got on chart after she had her son "Marcello Moores" in 2011 and had vancomycin (at which time she had RMS) and was told that she shouldn't take pcn because it is in the same family as pcn which of course becomes confusing regarding accuracy.  The pt says she has never had anaphylaxis or angioedema to any medicines she has ever taken.  Pt's husband even called her mother for verification and her mother reinforced that pt had received amoxicillin multiple times as a child with no adverse reaction and the only allergies she remembers is to ceclor (rash per pt) and Tdap (swelling in leg per pt).  With all of this information, I suspect it is at least ok to try a dose of pcn and see how pt responds.  The pt is reluctant to take levaquin also d/t her google  search and feels much more comfortable trying pcn.

## 2017-12-09 NOTE — Anesthesia Pain Management Evaluation Note (Signed)
  CRNA Pain Management Visit Note  Patient: Margaret Leonard, 32 y.o., female  "[facility]. We have an anesthesia team available at all times to provide care throughout the hospital, including epidural management and anesthesia for C-section. I don't know your plan for the delivery whether it a natural birth, water birth, IV sedation, nitrous supplementation, doula or epidural, but we want to meet your pain goals."   1.Was your pain managed to your expectations on prior hospitalizations?   Yes   2.What is your expectation for pain management during this hospitalization?     Epidural  3.How can we help you reach that goal? epidural  Record the patient's initial score and the patient's pain goal.   Pain: 2  Pain Goal: 5 [facility] wants you to be able to say your pain was always managed very well.  Katti Pelle 12/09/2017

## 2017-12-09 NOTE — Progress Notes (Signed)
Margaret Leonard is a 32 y.o. [phone] at 6857w1d   Subjective: No complaints  Objective: BP 121/74   Pulse 81   Temp 98 F (36.7 C) (Oral)   Resp (!) 22   Ht 5\' 4"  (1.626 m)   Wt 133 lb (60.3 kg)   LMP 02/08/2017 (Exact Date)   BMI 22.83 kg/m  No intake/output data recorded. Total I/O In: -  Out: 100 [Blood:100]  FHT:  FHR: 125 bpm, variability: moderate,  accelerations:  Present,  decelerations:  Absent UC:   regular, every 2-5 minutes SVE:   Dilation: 4 Effacement (%): 90 Station: Plus 0 Exam by:: nancy CNM  Labs: Lab Results  Component Value Date   WBC 9.7 12/09/2017   HGB 12.6 12/09/2017   HCT 36.2 12/09/2017   MCV 94.3 12/09/2017   PLT 164 12/09/2017    Assessment / Plan: Augmentation of labor, progressing well  Labor: Progressing normally Preeclampsia:  no signs or symptoms of toxicity Fetal Wellbeing:  Category I Pain Control:  pain medicine upon request I/D:  GBS + on PCN (NO PCN ALLERGY) Anticipated MOD:  NSVD  Margaret Leonard 12/09/2017, 7:53 PM

## 2017-12-10 LAB — CBC
HEMATOCRIT: 30.3 % — AB (ref 36.0–46.0)
Hemoglobin: 10.5 g/dL — ABNORMAL LOW (ref 12.0–15.0)
MCH: 32.5 pg (ref 26.0–34.0)
MCHC: 34.7 g/dL (ref 30.0–36.0)
MCV: 93.8 fL (ref 78.0–100.0)
Platelets: 137 10*3/uL — ABNORMAL LOW (ref 150–400)
RBC: 3.23 MIL/uL — ABNORMAL LOW (ref 3.87–5.11)
RDW: 13.3 % (ref 11.5–15.5)
WBC: 12.8 10*3/uL — ABNORMAL HIGH (ref 4.0–10.5)

## 2017-12-10 LAB — RPR: RPR: NONREACTIVE

## 2017-12-10 NOTE — Lactation Note (Signed)
This note was copied from a baby's chart. Lactation Consultation Note  Patient Name: Margaret Leonard ZOXWR'UToday's Date: 12/10/2017 Reason for consult: Follow-up assessment;Term  LC follow up visit: G4P4 mother whose infant is now 7220 hours old.    Infant is ready to breastfeed.  Mother states that it hurts when baby latches on and her left nipple is more sore than the right.  After assessing her breasts and nipples LC noted a compression stripe on the left nipple.  The right nipple is rounded with no compression stripe.  Explained to mother how an infant latches on and how to maintain a deep latch.  It appears the infant was not latched on properly.  LC suggested the football hold and the infant was able to latch on the right breast without difficulty and maintain a good rhythmic suck.  Audible swallows were noted.  Mother stated that it did not pinch like the previous latch.  Infant very sleepy after breastfeeding on the right side.  Mother hand expressed approximately 3 mls from left breast and LC taught her how to spoon feed back to the baby.    Encouraged mother to continue feeding 8-12 times in 24 hours or earlier if baby shows cues and to pump/hand express from the breasts after feeding to increase milk production and to empty the breast if infant only feeds on one side.  Mother very appreciative of help and verbalized understanding.    Maternal Data Formula Feeding for Exclusion: No Has patient been taught Hand Expression?: Yes Does the patient have breastfeeding experience prior to this delivery?: Yes  Feeding Feeding Type: Breast Fed Length of feed: 20 min  LATCH Score Latch: Grasps breast easily, tongue down, lips flanged, rhythmical sucking.  Audible Swallowing: Spontaneous and intermittent  Type of Nipple: Everted at rest and after stimulation  Comfort (Breast/Nipple): Filling, red/small blisters or bruises, mild/mod discomfort  Hold (Positioning): Assistance needed to  correctly position infant at breast and maintain latch.  LATCH Score: 8  Interventions Interventions: Breast feeding basics reviewed;Assisted with latch;Skin to skin;Breast massage;Hand express;Position options;Support pillows;Adjust position;Breast compression;Shells  Lactation Tools Discussed/Used Tools: Shells Shell Type: Inverted   Consult Status Consult Status: Follow-up Date: 12/11/17 Follow-up type: In-patient    Dora SimsBeth R Yvana Samonte 12/10/2017, 4:13 PM

## 2017-12-10 NOTE — Progress Notes (Signed)
Subjective: Postpartum Day 1: Vaginal delivery, 2nd laceration Patient up ad lib, reports no syncope or dizziness. Feeding:  breast Contraceptive plan:  undecided  Objective: Vital signs in last 24 hours: Temp:  [96.7 F (35.9 C)-98.7 F (37.1 C)] 98.1 F (36.7 C) (04/11 0345) Pulse Rate:  [69-87] 69 (04/11 0345) Resp:  [17-22] 17 (04/11 0345) BP: (99-130)/(56-85) 108/57 (04/11 0345) Weight:  [60.3 kg (133 lb)] 60.3 kg (133 lb) (04/10 1110)  Physical Exam:  General: alert, cooperative and no distress Lochia: appropriate Uterine Fundus: firm Perineum: healing well DVT Evaluation: No evidence of DVT seen on physical exam. Negative Homan's sign.   CBC Latest Ref Rng & Units 12/10/2017 12/09/2017 12/13/2013  WBC 4.0 - 10.5 K/uL 12.8(H) 9.7 13.0(H)  Hemoglobin 12.0 - 15.0 g/dL 10.5(L) 12.6 10.2(L)  Hematocrit 36.0 - 46.0 % 30.3(L) 36.2 30.3(L)  Platelets 150 - 400 K/uL 137(L) 164 118(L)     Assessment/Plan: Status post vaginal delivery day 1. Stable Continue current care. Plan for discharge tomorrow and Lactation consult    Henderson Newcomerancy Jean ProtheroCNM 12/10/2017, 8:25 AM

## 2017-12-10 NOTE — Lactation Note (Signed)
This note was copied from a baby's chart. Lactation Consultation Note Baby BF on breast when LC entered rm. Mom stated BF going well.  Mom has everted round nipples. Hand expression w/colostrum noted.  Shells given and hand pump for pre-pumping to elongate the nipple.  Baby had a choking spell,baby swallowed sputum. Mom doing STS.  Mom's 4th child. 1st child now 627 yrs old BF for 16 months. 2nd child now 316 yrs old breast fed 14 months, her 3rd child 4 yrs old BF for 12 months.  Reviewed newborn behavior, feeding habits, STS, I&O, cluster feeding, breast massage, and assessing for transfer after feedings. Mom encouraged to feed baby 8-12 times/24 hours and with feeding cues.  WH/LC brochure given w/resources, support groups and LC services.  Patient Name: Margaret Leonard Today's Date: 12/10/2017 Reason for consult: Initial assessment   Maternal Data Has patient been taught Hand Expression?: Yes Does the patient have breastfeeding experience prior to this delivery?: Yes  Feeding Feeding Type: Breast Fed Length of feed: 15 min  LATCH Score Latch: Repeated attempts needed to sustain latch, nipple held in mouth throughout feeding, stimulation needed to elicit sucking reflex.  Audible Swallowing: A few with stimulation  Type of Nipple: Everted at rest and after stimulation  Comfort (Breast/Nipple): Soft / non-tender  Hold (Positioning): Assistance needed to correctly position infant at breast and maintain latch.  LATCH Score: 7  Interventions Interventions: Breast feeding basics reviewed;Support pillows;Position options;Skin to skin;Breast massage;Assisted with latch;Hand express;Shells;Pre-pump if needed;Hand pump;Adjust position;Breast compression  Lactation Tools Discussed/Used Tools: Shells;Pump Shell Type: Inverted Breast pump type: Manual Pump Review: Setup, frequency, and cleaning;Milk Storage Initiated by:: Peri JeffersonL. Murry Diaz RN IBCLC Date initiated:: 12/10/17   Consult  Status Consult Status: Follow-up Date: 12/11/17 Follow-up type: In-patient    Charyl DancerCARVER, Kanaan Kagawa G 12/10/2017, 4:56 AM

## 2017-12-11 ENCOUNTER — Telehealth (HOSPITAL_COMMUNITY): Payer: Self-pay | Admitting: Lactation Services

## 2017-12-11 NOTE — Plan of Care (Signed)
Progressing appropriately. Encouraged to call for assistance as needed with feeding, and for LATCH assessment. 

## 2017-12-11 NOTE — Discharge Summary (Signed)
OB Discharge Summary     Patient Name: Margaret Leonard DOB: 1986/01/21 MRN: [id number]  Date of admission: 12/09/2017 Delivering MD: Margaret Leonard   Date of discharge: 12/11/2017  Admitting diagnosis: ROM Intrauterine pregnancy: 2928w3d     Secondary diagnosis:  Active Problems:   Full-term premature rupture of membranes, unspecified as to length of time between rupture and onset of labor   Rupture of membranes with delay of delivery   SVD (spontaneous vaginal delivery)  Additional problems: none     Discharge diagnosis: Term Pregnancy Delivered                                                                                                Post partum procedures:None  Augmentation: N/A  Complications: None  Hospital course:  Onset of Labor With Vaginal Delivery     32 y.o. yo [id number] at 8928w3d was admitted in Active Labor on 12/09/2017. Patient had an uncomplicated labor course as follows:  Membrane Rupture Time/Date: 10:30 PM ,12/09/2017   Intrapartum Procedures: Episiotomy: None [1]                                         Lacerations:  2nd degree [3];Vaginal [6]  Patient had a delivery of a Viable infant. 12/09/2017  Information for the patient's newborn:  Margaret Leonard [[id number]][[id number]]       Pateint had an uncomplicated postpartum course.  She is ambulating, tolerating a regular diet, passing flatus, and urinating well. Patient is discharged home in stable condition on 12/11/17.   Physical exam  Vitals:   12/10/17 0345 12/10/17 0930 12/10/17 1748 12/11/17 0610  BP: (!) 108/57 113/61 100/78 117/85  Pulse: 69 76 76 81  Resp: 17 18 16 18   Temp: 98.1 F (36.7 C) 98.3 F (36.8 C) 98.4 F (36.9 C) 97.8 F (36.6 C)  TempSrc: Oral Oral Oral Oral  SpO2:  99%  98%  Weight:      Height:       General: alert, cooperative and no distress Lochia: appropriate Uterine Fundus: firm Incision: N/A DVT Evaluation: No evidence of DVT seen on physical exam. Negative Homan's  sign. Labs: Lab Results  Component Value Date   WBC 12.8 (H) 12/10/2017   HGB 10.5 (L) 12/10/2017   HCT 30.3 (L) 12/10/2017   MCV 93.8 12/10/2017   PLT 137 (L) 12/10/2017   No flowsheet data found.  Discharge instruction: per After Visit Summary and "Baby and Me Booklet".  After visit meds:  Allergies as of 12/11/2017      Reactions   Ceclor [cefaclor] Hives   Tdap [tetanus-diphth-acell Pertussis] Swelling   Severe leg swelling   Vancomycin Other (See Comments)   Red man reaction      Medication List    TAKE these medications   COD LIVER PO Take 1 capsule by mouth daily.   prenatal multivitamin Tabs tablet Take 1 tablet by mouth daily.   VITAMIN C PO Take 1 tablet by mouth daily.  Diet: routine diet  Activity: Advance as tolerated. Pelvic rest for 6 weeks.   Outpatient follow up:6 weeks Follow up Appt:No future appointments. Follow up Visit:No follow-ups on file.  Postpartum contraception: Undecided  Newborn Data: Live born female  Birth Weight: 6 lb 6.1 oz (2895 g) APGAR: 7, 8  Newborn Delivery   Birth date/time:  12/09/2017 19:24:00 Delivery type:  Vaginal, Spontaneous     Baby Feeding: Breast Disposition:home with mother   12/11/2017 Margaret Houseman, CNM

## 2017-12-11 NOTE — Lactation Note (Signed)
This note was copied from a baby's chart. Lactation Consultation Note  Patient Name: Margaret Leonard UJWJX'BToday's Date: 12/11/2017  [facility]:  G4P4 mother whose infant is now 6837 hours old.  Mother states that her latch issues from yesterday have been resolved.  Baby is feeding well and has adequate voids/stools.  Mother states her breasts feel "full".  She is not engorged.  Engorgement prevention/treatment discussed and mother verbalizes understanding.  She has a few manual pumps for home use and does not desire an electric pump.  She has never used an electric pump with any of her other children but knows how to rent one if needed.  Mom made aware of O/P services, breastfeeding support groups, community resources, and our phone # for post-discharge questions.       Walburga Hudman R Cedric Mcclaine 12/11/2017, 8:38 AM

## 2017-12-11 NOTE — Telephone Encounter (Signed)
Mom left a message with RN regarding a question she had about BF. Called mom at 3:30 pm and she was concerned about BF while having the stomach flu. Explained to mom that there are only a few conditions that are not compatible with BF; and the stomach flu is not one of them.  Mom was a little nervous and emotional because she was worried about her newborn getting sick with the flu. She has already called her baby's pediatrician and she was told that if baby were to start showing symptoms to take her to the Pediatric ER. Reassured mom that the benefits of BF will outweigh the risks and that she should continue BF her baby STS 8-12 times/24 hours or sooner if no cues are present. Mom is aware of LC services and will contact PRN.

## 2018-03-09 ENCOUNTER — Encounter (HOSPITAL_COMMUNITY): Payer: Self-pay

## 2019-09-02 NOTE — L&D Delivery Note (Signed)
Delivery Note Labor onset: 01/02/2020  Labor Onset Time: 0100 Complete dilation at 2:37 PM  Onset of pushing at 1438 FHR second stage Cat 1    Analgesia/Anesthesia intrapartum: IV sedation x1, unmedicated in second stage  Involuntary pushing with intense maternal urge. Delivery of a viable female at 24. Fetal head delivered in ROA position. Nuchal cord none. Infant w/ spont cry immediately after birth, placed on maternal abd, dried, and tactile stim.  Cord double clamped after pulsation ceased and cut by Father, Molli Hazard.  Cord blood sample collected Arterial cord blood sample n/a.  Placenta delivered Tomasa Blase, intact, with 3 VC.  Placenta to parents for encapsulation. Mother aware of GBS status and possible contamination risk to encapsulation. Uterine tone firm bleeding small  1st degree laceration identified.  Anesthesia: Lidocaine 1% Repair: 4-0 vicryl and 2-0 vicryl EBL (mL): 150 Complications: none APGAR: APGAR (1 MIN): 8   APGAR (5 MINS): 9   APGAR (10 MINS):   Mom to postpartum.  Baby to Couplet care / Skin to Skin.  Roma Schanz MSN, CNM 01/02/2020, 3:46 PM

## 2019-09-27 ENCOUNTER — Other Ambulatory Visit: Payer: Self-pay

## 2019-09-27 ENCOUNTER — Encounter (HOSPITAL_COMMUNITY): Payer: Self-pay | Admitting: Family Medicine

## 2019-09-27 ENCOUNTER — Inpatient Hospital Stay (HOSPITAL_COMMUNITY)
Admission: AD | Admit: 2019-09-27 | Discharge: 2019-09-27 | Disposition: A | Discharge status: Home / Self Care | Payer: Medicaid Other | Attending: Family Medicine | Admitting: Family Medicine

## 2019-09-27 DIAGNOSIS — Z3A26 26 weeks gestation of pregnancy: Secondary | ICD-10-CM

## 2019-09-27 DIAGNOSIS — O26892 Other specified pregnancy related conditions, second trimester: Secondary | ICD-10-CM

## 2019-09-27 DIAGNOSIS — R300 Dysuria: Secondary | ICD-10-CM | POA: Diagnosis not present

## 2019-09-27 DIAGNOSIS — R102 Pelvic and perineal pain: Secondary | ICD-10-CM | POA: Insufficient documentation

## 2019-09-27 DIAGNOSIS — O26893 Other specified pregnancy related conditions, third trimester: Secondary | ICD-10-CM

## 2019-09-27 LAB — WET PREP, GENITAL
Clue Cells Wet Prep HPF POC: NONE SEEN
Sperm: NONE SEEN
Trich, Wet Prep: NONE SEEN
Yeast Wet Prep HPF POC: NONE SEEN

## 2019-09-27 LAB — URINALYSIS, ROUTINE W REFLEX MICROSCOPIC
Bilirubin Urine: NEGATIVE
Glucose, UA: NEGATIVE mg/dL
Hgb urine dipstick: NEGATIVE
Ketones, ur: NEGATIVE mg/dL
Leukocytes,Ua: NEGATIVE
Nitrite: NEGATIVE
Protein, ur: NEGATIVE mg/dL
Specific Gravity, Urine: 1.003 — ABNORMAL LOW (ref 1.005–1.030)
pH: 7 (ref 5.0–8.0)

## 2019-09-27 NOTE — MAU Provider Note (Signed)
History     CSN: [id number]  Arrival date and time: 09/27/19 1750   First Provider Initiated Contact with Patient 09/27/19 1848      Chief Complaint  Patient presents with  . burning with urination   Margaret Leonard is a 34 y.o. [id number] at [redacted]w[redacted]d who presents today with pain with urination. She states that she called her OB office, and she was told to tried [employer]. She tried that and it was somewhat better. However, it returned. She now has burning with every urination and sometimes between urination. She did a warm water enema over the weekend and had a BM and felt some of the pain and pressure got better. She denies intercourse in the last 24 hours. She denies any contractions, vaginal bleeding or LOF. She reports normal fetal movement.   Dysuria  This is a new problem. The current episode started in the past 7 days. The problem occurs every urination. The problem has been waxing and waning. The pain is at a severity of 7/10. Associated symptoms include frequency. Pertinent negatives include no chills, nausea or vomiting. She has tried increased fluids (apple cider vinegar, probiotics, monistat ) for the symptoms. The treatment provided mild relief.    OB History    Gravida  5   Para  4   Term  4   Preterm  0   AB  0   Living  4     SAB  0   TAB  0   Ectopic  0   Multiple  0   Live Births  4           Past Medical History:  Diagnosis Date  . Bacterial infection   . Female bladder prolapse   . FHx: asthma   . FHx: cancer   . FHx: diabetes mellitus   . FHx: heart disease   . FHx: hypertension   . GBS carrier   . H/O varicella   . Infection    UTI  . Urinary tract infection   . Yeast infection     Past Surgical History:  Procedure Laterality Date  . WISDOM TOOTH EXTRACTION      Family History  Problem Relation Age of Onset  . Hypertension Mother   . Birth defects Mother        extra digit  . Diabetes Maternal Grandfather   . Hypertension  Maternal Grandfather   . Cancer Paternal Grandmother        lymphoma  . Heart disease Paternal Grandfather   . Other Cousin        reaction to vaccines  . Anesthesia problems Neg Hx     Social History   Tobacco Use  . Smoking status: Never Smoker  . Smokeless tobacco: Never Used  Substance Use Topics  . Alcohol use: No  . Drug use: No    Allergies:  Allergies  Allergen Reactions  . Ceclor [Cefaclor] Hives  . Tdap [Tetanus-Diphth-Acell Pertussis] Swelling    Severe leg swelling  . Vancomycin Other (See Comments)    Red man reaction    Medications Prior to Admission  Medication Sig Dispense Refill Last Dose  . Ascorbic Acid (VITAMIN C PO) Take 1 tablet by mouth daily.    at not taking  . Cod Liver Oil (COD LIVER PO) Take 1 capsule by mouth daily.    at not taking  . Prenatal Vit-Fe Fumarate-FA (PRENATAL MULTIVITAMIN) TABS Take 1 tablet by mouth daily.  Review of Systems  Constitutional: Negative for chills and fever.  Gastrointestinal: Negative for nausea and vomiting.  Genitourinary: Positive for dysuria, frequency and pelvic pain. Negative for vaginal bleeding and vaginal discharge.   Physical Exam   Blood pressure (!) 113/59, pulse 89, temperature 98.8 F (37.1 C), temperature source Oral, resp. rate 18, weight 61.4 kg, SpO2 100 %, unknown if currently breastfeeding.  Physical Exam  Nursing note and vitals reviewed. Constitutional: She is oriented to person, place, and time. She appears well-developed and well-nourished. No distress.  HENT:  Head: Normocephalic.  Cardiovascular: Normal rate.  Respiratory: Effort normal.  GI: Soft. There is no abdominal tenderness. There is no rebound.  Genitourinary:    Genitourinary Comments:  External: no lesion Vagina: small amount of white discharge Cervix: pink, smooth, no CMT, closed/thick  Uterus: AGA    Neurological: She is alert and oriented to person, place, and time.  Skin: Skin is warm and dry.   Psychiatric: She has a normal mood and affect.   NST:  Baseline: 150 Variability: moderate Accels: 10x10 Decels: none Toco: none Appropriate for GA   Results for orders placed or performed during the hospital encounter of 09/27/19 (from the past 24 hour(s))  Urinalysis, Routine w reflex microscopic     Status: Abnormal   Collection Time: 09/27/19  6:05 PM  Result Value Ref Range   Color, Urine COLORLESS (A) YELLOW   APPearance CLEAR CLEAR   Specific Gravity, Urine 1.003 (L) 1.005 - 1.030   pH 7.0 5.0 - 8.0   Glucose, UA NEGATIVE NEGATIVE mg/dL   Hgb urine dipstick NEGATIVE NEGATIVE   Bilirubin Urine NEGATIVE NEGATIVE   Ketones, ur NEGATIVE NEGATIVE mg/dL   Protein, ur NEGATIVE NEGATIVE mg/dL   Nitrite NEGATIVE NEGATIVE   Leukocytes,Ua NEGATIVE NEGATIVE  Wet prep, genital     Status: Abnormal   Collection Time: 09/27/19  7:36 PM  Result Value Ref Range   Yeast Wet Prep HPF POC NONE SEEN NONE SEEN   Trich, Wet Prep NONE SEEN NONE SEEN   Clue Cells Wet Prep HPF POC NONE SEEN NONE SEEN   WBC, Wet Prep HPF POC MANY (A) NONE SEEN   Sperm NONE SEEN     MAU Course  Procedures  MDM   Assessment and Plan   1. Dysuria during pregnancy in third trimester   2. [redacted] weeks gestation of pregnancy   3. Pelvic pain affecting pregnancy in third trimester, antepartum    DC home GC/CT pending Urine Culture pending 3rd Trimester precautions  Bleeding precautions PTL precautions  Fetal kick counts RX: none  Return to MAU as needed FU with OB as planned  Follow-up Information    [facility] Follow up.   Specialty: Obstetrics and Gynecology Contact information: [address] [place] [address] [phone]         Thressa Sheller DNP, CNM  09/27/19  8:00 PM

## 2019-09-27 NOTE — Discharge Instructions (Signed)
Third Trimester of Pregnancy The third trimester is from week 28 through week 40 (months 7 through 9). The third trimester is a time when the unborn baby (fetus) is growing rapidly. At the end of the ninth month, the fetus is about 20 inches in length and weighs 6-10 pounds. Body changes during your third trimester Your body will continue to go through many changes during pregnancy. The changes vary from woman to woman. During the third trimester:  Your weight will continue to increase. You can expect to gain 25-35 pounds (11-16 kg) by the end of the pregnancy.  You may begin to get stretch marks on your hips, abdomen, and breasts.  You may urinate more often because the fetus is moving lower into your pelvis and pressing on your bladder.  You may develop or continue to have heartburn. This is caused by increased hormones that slow down muscles in the digestive tract.  You may develop or continue to have constipation because increased hormones slow digestion and cause the muscles that push waste through your intestines to relax.  You may develop hemorrhoids. These are swollen veins (varicose veins) in the rectum that can itch or be painful.  You may develop swollen, bulging veins (varicose veins) in your legs.  You may have increased body aches in the pelvis, back, or thighs. This is due to weight gain and increased hormones that are relaxing your joints.  You may have changes in your hair. These can include thickening of your hair, rapid growth, and changes in texture. Some women also have hair loss during or after pregnancy, or hair that feels dry or thin. Your hair will most likely return to normal after your baby is born.  Your breasts will continue to grow and they will continue to become tender. A yellow fluid (colostrum) may leak from your breasts. This is the first milk you are producing for your baby.  Your belly button may stick out.  You may notice more swelling in your hands,  face, or ankles.  You may have increased tingling or numbness in your hands, arms, and legs. The skin on your belly may also feel numb.  You may feel short of breath because of your expanding uterus.  You may have more problems sleeping. This can be caused by the size of your belly, increased need to urinate, and an increase in your body's metabolism.  You may notice the fetus "dropping," or moving lower in your abdomen (lightening).  You may have increased vaginal discharge.  You may notice your joints feel loose and you may have pain around your pelvic bone. What to expect at prenatal visits You will have prenatal exams every 2 weeks until week 36. Then you will have weekly prenatal exams. During a routine prenatal visit:  You will be weighed to make sure you and the baby are growing normally.  Your blood pressure will be taken.  Your abdomen will be measured to track your baby's growth.  The fetal heartbeat will be listened to.  Any test results from the previous visit will be discussed.  You may have a cervical check near your due date to see if your cervix has softened or thinned (effaced).  You will be tested for Group B streptococcus. This happens between 35 and 37 weeks. Your health care provider may ask you:  What your birth plan is.  How you are feeling.  If you are feeling the baby move.  If you have had any abnormal   symptoms, such as leaking fluid, bleeding, severe headaches, or abdominal cramping.  If you are using any tobacco products, including cigarettes, chewing tobacco, and electronic cigarettes.  If you have any questions. Other tests or screenings that may be performed during your third trimester include:  Blood tests that check for low iron levels (anemia).  Fetal testing to check the health, activity level, and growth of the fetus. Testing is done if you have certain medical conditions or if there are problems during the pregnancy.  Nonstress test  (NST). This test checks the health of your baby to make sure there are no signs of problems, such as the baby not getting enough oxygen. During this test, a belt is placed around your belly. The baby is made to move, and its heart rate is monitored during movement. What is false labor? False labor is a condition in which you feel small, irregular tightenings of the muscles in the womb (contractions) that usually go away with rest, changing position, or drinking water. These are called Braxton Hicks contractions. Contractions may last for hours, days, or even weeks before true labor sets in. If contractions come at regular intervals, become more frequent, increase in intensity, or become painful, you should see your health care provider. What are the signs of labor?  Abdominal cramps.  Regular contractions that start at 10 minutes apart and become stronger and more frequent with time.  Contractions that start on the top of the uterus and spread down to the lower abdomen and back.  Increased pelvic pressure and dull back pain.  A watery or bloody mucus discharge that comes from the vagina.  Leaking of amniotic fluid. This is also known as your "water breaking." It could be a slow trickle or a gush. Let your health care provider know if it has a color or strange odor. If you have any of these signs, call your health care provider right away, even if it is before your due date. Follow these instructions at home: Medicines  Follow your health care provider's instructions regarding medicine use. Specific medicines may be either safe or unsafe to take during pregnancy.  Take a prenatal vitamin that contains at least 600 micrograms (mcg) of folic acid.  If you develop constipation, try taking a stool softener if your health care provider approves. Eating and drinking   Eat a balanced diet that includes fresh fruits and vegetables, whole grains, good sources of protein such as meat, eggs, or tofu,  and low-fat dairy. Your health care provider will help you determine the amount of weight gain that is right for you.  Avoid raw meat and uncooked cheese. These carry germs that can cause birth defects in the baby.  If you have low calcium intake from food, talk to your health care provider about whether you should take a daily calcium supplement.  Eat four or five small meals rather than three large meals a day.  Limit foods that are high in fat and processed sugars, such as fried and sweet foods.  To prevent constipation: ? Drink enough fluid to keep your urine clear or pale yellow. ? Eat foods that are high in fiber, such as fresh fruits and vegetables, whole grains, and beans. Activity  Exercise only as directed by your health care provider. Most women can continue their usual exercise routine during pregnancy. Try to exercise for 30 minutes at least 5 days a week. Stop exercising if you experience uterine contractions.  Avoid heavy lifting.  Do   not exercise in extreme heat or humidity, or at high altitudes.  Wear low-heel, comfortable shoes.  Practice good posture.  You may continue to have sex unless your health care provider tells you otherwise. Relieving pain and discomfort  Take frequent breaks and rest with your legs elevated if you have leg cramps or low back pain.  Take warm sitz baths to soothe any pain or discomfort caused by hemorrhoids. Use hemorrhoid cream if your health care provider approves.  Wear a good support bra to prevent discomfort from breast tenderness.  If you develop varicose veins: ? Wear support pantyhose or compression stockings as told by your healthcare provider. ? Elevate your feet for 15 minutes, 3-4 times a day. Prenatal care  Write down your questions. Take them to your prenatal visits.  Keep all your prenatal visits as told by your health care provider. This is important. Safety  Wear your seat belt at all times when driving.  Make  a list of emergency phone numbers, including numbers for family, friends, the hospital, and police and fire departments. General instructions  Avoid cat litter boxes and soil used by cats. These carry germs that can cause birth defects in the baby. If you have a cat, ask someone to clean the litter box for you.  Do not travel far distances unless it is absolutely necessary and only with the approval of your health care provider.  Do not use hot tubs, steam rooms, or saunas.  Do not drink alcohol.  Do not use any products that contain nicotine or tobacco, such as cigarettes and e-cigarettes. If you need help quitting, ask your health care provider.  Do not use any medicinal herbs or unprescribed drugs. These chemicals affect the formation and growth of the baby.  Do not douche or use tampons or scented sanitary pads.  Do not cross your legs for long periods of time.  To prepare for the arrival of your baby: ? Take prenatal classes to understand, practice, and ask questions about labor and delivery. ? Make a trial run to the hospital. ? Visit the hospital and tour the maternity area. ? Arrange for maternity or paternity leave through employers. ? Arrange for family and friends to take care of pets while you are in the hospital. ? Purchase a rear-facing car seat and make sure you know how to install it in your car. ? Pack your hospital bag. ? Prepare the baby's nursery. Make sure to remove all pillows and stuffed animals from the baby's crib to prevent suffocation.  Visit your dentist if you have not gone during your pregnancy. Use a soft toothbrush to brush your teeth and be gentle when you floss. Contact a health care provider if:  You are unsure if you are in labor or if your water has broken.  You become dizzy.  You have mild pelvic cramps, pelvic pressure, or nagging pain in your abdominal area.  You have lower back pain.  You have persistent nausea, vomiting, or  diarrhea.  You have an unusual or bad smelling vaginal discharge.  You have pain when you urinate. Get help right away if:  Your water breaks before 37 weeks.  You have regular contractions less than 5 minutes apart before 37 weeks.  You have a fever.  You are leaking fluid from your vagina.  You have spotting or bleeding from your vagina.  You have severe abdominal pain or cramping.  You have rapid weight loss or weight gain.  You have   shortness of breath with chest pain.  You notice sudden or extreme swelling of your face, hands, ankles, feet, or legs.  Your baby makes fewer than 10 movements in 2 hours.  You have severe headaches that do not go away when you take medicine.  You have vision changes. Summary  The third trimester is from week 28 through week 40, months 7 through 9. The third trimester is a time when the unborn baby (fetus) is growing rapidly.  During the third trimester, your discomfort may increase as you and your baby continue to gain weight. You may have abdominal, leg, and back pain, sleeping problems, and an increased need to urinate.  During the third trimester your breasts will keep growing and they will continue to become tender. A yellow fluid (colostrum) may leak from your breasts. This is the first milk you are producing for your baby.  False labor is a condition in which you feel small, irregular tightenings of the muscles in the womb (contractions) that eventually go away. These are called Braxton Hicks contractions. Contractions may last for hours, days, or even weeks before true labor sets in.  Signs of labor can include: abdominal cramps; regular contractions that start at 10 minutes apart and become stronger and more frequent with time; watery or bloody mucus discharge that comes from the vagina; increased pelvic pressure and dull back pain; and leaking of amniotic fluid. This information is not intended to replace advice given to you by your  health care provider. Make sure you discuss any questions you have with your health care provider. Document Revised: 12/09/2018 Document Reviewed: 09/23/2016 Elsevier Patient Education  2020 [employer]  

## 2019-09-27 NOTE — MAU Note (Signed)
Ins changed, was getting care in [facility].  Started having burning with urination on Sun the 17th, Wed was feeling better. By Friday, increased pressure, burning with urination worse then it had been.  Called OB office, told to get monistat 7.  Got the Monistat, felt better on Sat, came back on Sun. Wasn't as bad, but back. Went to office on Mon, had urine check. They called her back today, was told to make another appt and come in to get checked.  Then was told to go to [facility] to be monitored and checked. Has also been constipated

## 2019-09-28 LAB — GC/CHLAMYDIA PROBE AMP (~~LOC~~) NOT AT ARMC
Chlamydia: NEGATIVE
Comment: NEGATIVE
Comment: NORMAL
Neisseria Gonorrhea: NEGATIVE

## 2019-09-28 LAB — CULTURE, OB URINE: Culture: 20000 — AB

## 2019-09-29 ENCOUNTER — Telehealth: Payer: Self-pay | Admitting: Student

## 2019-09-29 ENCOUNTER — Encounter: Payer: Self-pay | Admitting: Student

## 2019-09-29 DIAGNOSIS — R8271 Bacteriuria: Secondary | ICD-10-CM

## 2019-09-29 MED ORDER — AMOXICILLIN 875 MG PO TABS: 875.0000 mg | ORAL_TABLET | Freq: Two times a day (BID) | ORAL | 0 refills | Status: AC

## 2019-09-29 NOTE — Telephone Encounter (Signed)
Patient called and verified her identity via birth date and last 4 of her SSN.  Patient agreeable to results via phone and was informed of positive urine culture. Will send antibiotic to her pharmacy. Patient to inform her ob/gyn about GBS in her urine. All questions answered.     Judeth Horn, NP

## 2019-10-07 ENCOUNTER — Other Ambulatory Visit: Payer: Self-pay

## 2019-10-07 ENCOUNTER — Encounter (HOSPITAL_COMMUNITY): Payer: Self-pay | Admitting: Obstetrics & Gynecology

## 2019-10-07 ENCOUNTER — Inpatient Hospital Stay (HOSPITAL_COMMUNITY)
Admission: AD | Admit: 2019-10-07 | Discharge: 2019-10-07 | Disposition: A | Discharge status: Home / Self Care | Payer: Medicaid Other | Attending: Obstetrics & Gynecology | Admitting: Obstetrics & Gynecology

## 2019-10-07 DIAGNOSIS — Z3A28 28 weeks gestation of pregnancy: Secondary | ICD-10-CM

## 2019-10-07 DIAGNOSIS — O09523 Supervision of elderly multigravida, third trimester: Secondary | ICD-10-CM | POA: Insufficient documentation

## 2019-10-07 DIAGNOSIS — Z3689 Encounter for other specified antenatal screening: Secondary | ICD-10-CM | POA: Diagnosis not present

## 2019-10-07 DIAGNOSIS — Z887 Allergy status to serum and vaccine status: Secondary | ICD-10-CM | POA: Insufficient documentation

## 2019-10-07 DIAGNOSIS — Z833 Family history of diabetes mellitus: Secondary | ICD-10-CM | POA: Insufficient documentation

## 2019-10-07 DIAGNOSIS — R300 Dysuria: Secondary | ICD-10-CM | POA: Insufficient documentation

## 2019-10-07 DIAGNOSIS — Z8249 Family history of ischemic heart disease and other diseases of the circulatory system: Secondary | ICD-10-CM | POA: Diagnosis not present

## 2019-10-07 DIAGNOSIS — R350 Frequency of micturition: Secondary | ICD-10-CM | POA: Insufficient documentation

## 2019-10-07 DIAGNOSIS — O26893 Other specified pregnancy related conditions, third trimester: Secondary | ICD-10-CM | POA: Insufficient documentation

## 2019-10-07 DIAGNOSIS — Z881 Allergy status to other antibiotic agents status: Secondary | ICD-10-CM | POA: Insufficient documentation

## 2019-10-07 DIAGNOSIS — Z87440 Personal history of urinary (tract) infections: Secondary | ICD-10-CM | POA: Insufficient documentation

## 2019-10-07 LAB — URINALYSIS, ROUTINE W REFLEX MICROSCOPIC
Bilirubin Urine: NEGATIVE
Glucose, UA: NEGATIVE mg/dL
Hgb urine dipstick: NEGATIVE
Ketones, ur: NEGATIVE mg/dL
Leukocytes,Ua: NEGATIVE
Nitrite: NEGATIVE
Protein, ur: NEGATIVE mg/dL
Specific Gravity, Urine: 1.008 (ref 1.005–1.030)
pH: 8 (ref 5.0–8.0)

## 2019-10-07 LAB — FETAL FIBRONECTIN: Fetal Fibronectin: NEGATIVE

## 2019-10-07 MED ORDER — NITROFURANTOIN MONOHYD MACRO 100 MG PO CAPS: 100.0000 mg | ORAL_CAPSULE | Freq: Two times a day (BID) | ORAL | 0 refills | Status: AC

## 2019-10-07 MED ORDER — ACETAMINOPHEN 500 MG PO TABS
1000.0000 mg | ORAL_TABLET | Freq: Once | ORAL | Status: AC
  Administered 2019-10-07: 12:00:00 1000 mg via ORAL
  Filled 2019-10-07: qty 2

## 2019-10-07 MED ORDER — PHENAZOPYRIDINE HCL 100 MG PO TABS
200.0000 mg | ORAL_TABLET | Freq: Once | ORAL | Status: AC
  Administered 2019-10-07: 200 mg via ORAL
  Filled 2019-10-07: qty 2

## 2019-10-07 NOTE — MAU Provider Note (Signed)
History     CSN: [id number]  Arrival date and time: 10/07/19 7353   First Provider Initiated Contact with Patient 10/07/19 1021      Chief Complaint  Patient presents with  . Back Pain  . burning with urination   [name] is a 34 y.o. [id number] at [redacted]w[redacted]d who presents to MAU for possible UTI, with similar symptoms to what she had when she was previously diagnosed with a UTI. Pt reports she was seen in MAU last week on 09/27/2019 for a burning sensation in the pelvis, the sensation of "early contractions," urinary frequency. UA grossly normal on that day, but urine culture positive for GBS. Pt reports she was called by MAU and sent amoxicillin for antibiotics, which she finished on Wednesday. Pt reports her symptoms from last week resolved on Tuesday, but returned last night. Pt denies anything in the vagina in the past 24hrs.  Onset: last night Location: pelvis/low back Duration: <24hrs Character: low back pressure worse than last week, urinary frequency, pelvic burning Aggravating/Associated: none/none Relieving: none Treatment: AZO (last night around 730pm - worked), heating pad - did not work, 2/3 an extra strength Tylenol  Pt denies VB, LOF, ctx, decreased FM, vaginal discharge/odor/itching. Pt denies N/V, abdominal pain, constipation, diarrhea. Pt denies fever, chills, fatigue, sweating or changes in appetite. Pt denies SOB or chest pain. Pt denies dizziness, HA, light-headedness, weakness.  Problems this pregnancy include: none. Allergies? Cefaclor, Tdap, vancomycin Current medications/supplements? PNVs, VitC Prenatal care provider? [facility], 10/18/2019   OB History    Gravida  5   Para  4   Term  4   Preterm  0   AB  0   Living  4     SAB  0   TAB  0   Ectopic  0   Multiple  0   Live Births  4           Past Medical History:  Diagnosis Date  . Bacterial infection   . Female bladder prolapse   . FHx: asthma   . FHx: cancer   . FHx:  diabetes mellitus   . FHx: heart disease   . FHx: hypertension   . GBS carrier   . H/O varicella   . Infection    UTI  . Urinary tract infection   . Yeast infection     Past Surgical History:  Procedure Laterality Date  . WISDOM TOOTH EXTRACTION      Family History  Problem Relation Age of Onset  . Hypertension Mother   . Birth defects Mother        extra digit  . Diabetes Maternal Grandfather   . Hypertension Maternal Grandfather   . Cancer Paternal Grandmother        lymphoma  . Heart disease Paternal Grandfather   . Other Cousin        reaction to vaccines  . Anesthesia problems Neg Hx     Social History   Tobacco Use  . Smoking status: Never Smoker  . Smokeless tobacco: Never Used  Substance Use Topics  . Alcohol use: No  . Drug use: No    Allergies:  Allergies  Allergen Reactions  . Ceclor [Cefaclor] Hives  . Tdap [Tetanus-Diphth-Acell Pertussis] Swelling    Severe leg swelling  . Vancomycin Other (See Comments)    Red man reaction    Medications Prior to Admission  Medication Sig Dispense Refill Last Dose  . Ascorbic Acid (VITAMIN C PO) Take  1 tablet by mouth daily.   10/07/2019 at 0730  . Prenatal Vit-Fe Fumarate-FA (PRENATAL MULTIVITAMIN) TABS Take 1 tablet by mouth daily.    10/07/2019 at 0730  . Cod Liver Oil (COD LIVER PO) Take 1 capsule by mouth daily.       Review of Systems  Constitutional: Negative for chills, diaphoresis, fatigue and fever.  Eyes: Negative for visual disturbance.  Respiratory: Negative for shortness of breath.   Cardiovascular: Negative for chest pain.  Gastrointestinal: Negative for abdominal pain, constipation, diarrhea, nausea and vomiting.  Genitourinary: Positive for dysuria, frequency and pelvic pain (burning). Negative for flank pain, urgency, vaginal bleeding and vaginal discharge.  Musculoskeletal: Positive for back pain (low back pressure).  Neurological: Negative for dizziness, weakness, light-headedness and  headaches.   Physical Exam   Blood pressure 113/66, pulse 92, temperature 98.6 F (37 C), temperature source Oral, resp. rate 18, height 5\' 5"  (1.651 m), weight 62 kg, SpO2 98 %, unknown if currently breastfeeding.  Patient Vitals for the past 24 hrs:  BP Temp Temp src Pulse Resp SpO2 Height Weight  10/07/19 0936 113/66 98.6 F (37 C) Oral 92 18 98 % 5\' 5"  (1.651 m) 62 kg   Physical Exam  Constitutional: She is oriented to person, place, and time. She appears well-developed and well-nourished. No distress.  HENT:  Head: Normocephalic and atraumatic.  Respiratory: Effort normal.  GI: Soft.  Genitourinary: There is no rash, tenderness or lesion on the right labia. There is no rash, tenderness or lesion on the left labia.    Genitourinary Comments: CE: long/closed/posterior   Neurological: She is alert and oriented to person, place, and time.  Skin: Skin is warm and dry. She is not diaphoretic.  Psychiatric: She has a normal mood and affect. Her behavior is normal. Judgment and thought content normal.   Results for orders placed or performed during the hospital encounter of 10/07/19 (from the past 24 hour(s))  Urinalysis, Routine w reflex microscopic     Status: Abnormal   Collection Time: 10/07/19 10:14 AM  Result Value Ref Range   Color, Urine YELLOW YELLOW   APPearance HAZY (A) CLEAR   Specific Gravity, Urine 1.008 1.005 - 1.030   pH 8.0 5.0 - 8.0   Glucose, UA NEGATIVE NEGATIVE mg/dL   Hgb urine dipstick NEGATIVE NEGATIVE   Bilirubin Urine NEGATIVE NEGATIVE   Ketones, ur NEGATIVE NEGATIVE mg/dL   Protein, ur NEGATIVE NEGATIVE mg/dL   Nitrite NEGATIVE NEGATIVE   Leukocytes,Ua NEGATIVE NEGATIVE  Fetal fibronectin     Status: None   Collection Time: 10/07/19 10:53 AM  Result Value Ref Range   Fetal Fibronectin NEGATIVE NEGATIVE   MAU Course  Procedures  MDM -urinary frequency, pelvic burning, low back pressure, c/w symptoms from last visit when patient was diagnosed with  GBS UTI -pt denies fever (temp 98.6), N/V, mid-back pain -WetPrep/GC/CT done 09/27/2019 - negative -CE: long/closed/posterior -UA: hazy/otherwise WNL,urine culture sent based on symptoms, requested sensitivity if +GBS -fFN: negative -EFM: reactive       -baseline: 135       -variability: moderate       -accels: present, 15x15       -decels: few variables       -TOCO: no ctx -consulted with Dr. 12/05/19 re: symptoms and previous urine culture. Per Dr. 09/29/2019, can treat with Macrobid 100mg  BID x3days until urine culture returns, along with Flexeril and pyridium. -pyridium and Tylenol 1000mg  given in MAU, pt reports she is driving and  unable to take Flexeril. -after medications, pt reports symptoms have mostly resolved -pt discharged to home in stable condition  Orders Placed This Encounter  Procedures  . Culture, OB Urine    If GBS+, please perform sensitivities.    Standing Status:   Standing    Number of Occurrences:   1  . Urinalysis, Routine w reflex microscopic    Standing Status:   Standing    Number of Occurrences:   1  . Fetal fibronectin    Standing Status:   Standing    Number of Occurrences:   1  . Discharge patient    Order Specific Question:   Discharge disposition    Answer:   01-Home or Self Care [1]    Order Specific Question:   Discharge patient date    Answer:   10/07/2019   Meds ordered this encounter  Medications  . phenazopyridine (PYRIDIUM) tablet 200 mg  . acetaminophen (TYLENOL) tablet 1,000 mg  . nitrofurantoin, macrocrystal-monohydrate, (MACROBID) 100 MG capsule    Sig: Take 1 capsule (100 mg total) by mouth 2 (two) times daily for 3 days.    Dispense:  6 capsule    Refill:  0    Order Specific Question:   Supervising Provider    Answer:   Verita Schneiders A [[id number]]   Assessment and Plan   1. Urinary frequency   2. Dysuria during pregnancy in third trimester   3. NST (non-stress test) reactive    Allergies as of 10/07/2019      Reactions    Ceclor [cefaclor] Hives   Tdap [tetanus-diphth-acell Pertussis] Swelling   Severe leg swelling   Vancomycin Other (See Comments)   Red man reaction      Medication List    STOP taking these medications   VITAMIN C PO     TAKE these medications   COD LIVER PO Take 1 capsule by mouth daily.   nitrofurantoin (macrocrystal-monohydrate) 100 MG capsule Commonly known as: Macrobid Take 1 capsule (100 mg total) by mouth 2 (two) times daily for 3 days.   prenatal multivitamin Tabs tablet Take 1 tablet by mouth daily.      -will call with culture results, if positive -RX Macrobid -pt declines RX for Flexeril, stating she will manage her discomfort with Tylenol at home, as this worked in MAU -pt advised to discontinue intake of extra acidic foods she reports she recently introduced to diet -information on pyelonephritis given to patient for informational purposes so patient does not have to utilize [employer] for information -PTL/pyelonephritis/return MAU precautions given -pt discharged to home in stable condition  Elmyra Ricks E Ben Habermann 10/07/2019, 1:18 PM

## 2019-10-07 NOTE — MAU Note (Signed)
Came here last wk, for lower back pain, burning sensation, frequency and urgency. Culture +, started rx on Thur, finished Wed. Last night started having syptoms again, especially pain in low back, then burning sensation started.  Took Tylenol and AZO.  Woke up at 5 with same symptoms. Anxious about a kidney infection.  No fever.

## 2019-10-07 NOTE — Discharge Instructions (Signed)
Fetal Fibronectin Test Why am I having this test? Fetal fibronectin (fFN) is a protein that your body makes during pregnancy. Having fFN in your vaginal fluid between 22 and 36 weeks of pregnancy could be a warning sign that your baby will be born early (prematurely). Babies born prematurely, or before 37 weeks, may have trouble breathing or feeding. You may have this test if you have symptoms of premature labor, such as:  Contractions.  More vaginal discharge.  Backache. What is being tested? This test checks the level of fFN in your vaginal fluid. What kind of sample is taken?  This test requires a sample of fluid from inside your vagina. This sample is collected by your health care provider using a cotton swab. How do I prepare for this test?  For 24 hours before the test, do not have sex or put anything into your vagina. Tell a health care provider about:  Any allergies you have.  Any medical conditions you have, especially any vaginal yeast infections or any symptoms of a yeast infection, including: ? Itchiness. ? Soreness. ? Unusual discharge. How are the results reported? Your results will be reported as positive or negative for fFN. If positive, results may also be given as micrograms of fFN per milliliter of vaginal fluid (mcg/mL). A result of 0.05 mcg/mL or less is considered negative. A false-positive result can occur. A false positive is incorrect because it means that a condition is present when it is not. A number of factors may lead to false-positive results, including vaginal bleeding, recent sexual intercourse, or a recent cervical exam. Your health care provider will talk to you about doing more tests to confirm your results. What do the results mean? A negative result means that no fFN was found in your vaginal fluid, meaning that premature delivery during the next 2 weeks is very unlikely. If you are still having symptoms of early labor, you may need to have this  test again in two weeks. A positive result means that fFN was found in your vaginal fluid. This means that your risk for premature labor is greater, but it does not mean that you will go into early labor. Your health care provider may do other tests and exams to closely monitor your pregnancy. Talk with your health care provider about what your results mean. Questions to ask your health care provider Ask your health care provider, or the department that is doing the test:  When will my results be ready?  How will I get my results?  What are my treatment options?  What other tests do I need?  What are my next steps? Summary  Fetal fibronectin (fFN) is a protein that your body makes during pregnancy.  Having fFN in your vaginal fluid between 22 and 36 weeks of pregnancy could be a warning sign that your baby will be born early (prematurely).  A negative result means that no fFN was found in your vaginal fluid, meaning that premature delivery over the next 2 weeks is very unlikely. This information is not intended to replace advice given to you by your health care provider. Make sure you discuss any questions you have with your health care provider. Document Revised: 04/09/2017 Document Reviewed: 04/09/2017 Elsevier Patient Education  2020 [employer] Preterm Labor and Birth Information  The normal length of a pregnancy is 39-41 weeks. Preterm labor is when labor starts before 37 completed weeks of pregnancy. What are the risk factors for preterm labor? Preterm labor  is more likely to occur in women who:  Have certain infections during pregnancy such as a bladder infection, sexually transmitted infection, or infection inside the uterus (chorioamnionitis).  Have a shorter-than-normal cervix.  Have gone into preterm labor before.  Have had surgery on their cervix.  Are younger than age 81 or older than age 85.  Are African American.  Are pregnant with twins or multiple babies  (multiple gestation).  Take street drugs or smoke while pregnant.  Do not gain enough weight while pregnant.  Became pregnant shortly after having been pregnant. What are the symptoms of preterm labor? Symptoms of preterm labor include:  Cramps similar to those that can happen during a menstrual period. The cramps may happen with diarrhea.  Pain in the abdomen or lower back.  Regular uterine contractions that may feel like tightening of the abdomen.  A feeling of increased pressure in the pelvis.  Increased watery or bloody mucus discharge from the vagina.  Water breaking (ruptured amniotic sac). Why is it important to recognize signs of preterm labor? It is important to recognize signs of preterm labor because babies who are born prematurely may not be fully developed. This can put them at an increased risk for:  Long-term (chronic) heart and lung problems.  Difficulty immediately after birth with regulating body systems, including blood sugar, body temperature, heart rate, and breathing rate.  Bleeding in the brain.  Cerebral palsy.  Learning difficulties.  Death. These risks are highest for babies who are born before 34 weeks of pregnancy. How is preterm labor treated? Treatment depends on the length of your pregnancy, your condition, and the health of your baby. It may involve:  Having a stitch (suture) placed in your cervix to prevent your cervix from opening too early (cerclage).  Taking or being given medicines, such as: ? Hormone medicines. These may be given early in pregnancy to help support the pregnancy. ? Medicine to stop contractions. ? Medicines to help mature the baby's lungs. These may be prescribed if the risk of delivery is high. ? Medicines to prevent your baby from developing cerebral palsy. If the labor happens before 34 weeks of pregnancy, you may need to stay in the hospital. What should I do if I think I am in preterm labor? If you think that  you are going into preterm labor, call your health care provider right away. How can I prevent preterm labor in future pregnancies? To increase your chance of having a full-term pregnancy:  Do not use any tobacco products, such as cigarettes, chewing tobacco, and e-cigarettes. If you need help quitting, ask your health care provider.  Do not use street drugs or medicines that have not been prescribed to you during your pregnancy.  Talk with your health care provider before taking any herbal supplements, even if you have been taking them regularly.  Make sure you gain a healthy amount of weight during your pregnancy.  Watch for infection. If you think that you might have an infection, get it checked right away.  Make sure to tell your health care provider if you have gone into preterm labor before. This information is not intended to replace advice given to you by your health care provider. Make sure you discuss any questions you have with your health care provider. Document Revised: 12/10/2018 Document Reviewed: 01/09/2016 Elsevier Patient Education  2020 [employer] Pyelonephritis During Pregnancy  What are the causes? This condition is caused by a bacterial infection in the lower urinary  tract that spreads to the kidney. What increases the risk? You are more likely to develop this condition if:  You have diabetes.  You have a history of frequent urinary tract infections. What are the signs or symptoms? Symptoms of this condition may begin with symptoms of a lower urinary tract infection. These may include:  A frequent urge to pass urine.  Burning pain when passing urine.  Pain and pressure in your lower abdomen.  Blood in your urine.  Cloudy or smelly urine. As the infection spreads to your kidney, you may have these symptoms:  Fever.  Chills.  Pain and tenderness in your upper abdomen or in your back and sides (flank pain). Flank pain often affects one side of the  body, usually the right side.  Nausea, vomiting, or loss of appetite. How is this diagnosed? This condition may be diagnosed based on:  Your symptoms and medical history.  A physical exam.  Tests to confirm the diagnosis. These may include: ? Blood tests to check kidney function and look for signs of infection. ? Urine tests to check for signs of infection, including bacteria, white or red blood cells, and protein. ? Tests to grow and identify the type of bacteria that is causing the infection (urine culture). ? Imaging studies of your kidneys to learn more about your condition. How is this treated? This condition is treated in the hospital with antibiotics that are given into one of your veins through an IV. Your health care provider:  Will start you on an antibiotic that is effective against common urinary tract infections.  May switch to another antibiotic if the results of your urine culture show that your infection is caused by different bacteria.  Will be careful to choose antibiotics that are the safest during pregnancy. Other treatments may include:  IV fluids if you are nauseous and not able to drink fluids.  Pain and fever medicines.  Medicines for nausea and vomiting. You will be able to go home when your infection is under control. To prevent another infection, you may need to continue taking antibiotics by mouth until your baby is born. Follow these instructions at home: Medicines   Take over-the-counter and prescription medicines only as told by your health care provider.  Take your antibiotic medicine as told by your health care provider. Do not stop taking the antibiotic even if you start to feel better.  Continue to take your prenatal vitamins. Lifestyle   Follow instructions from your health care provider about eating or drinking restrictions. You may need to avoid: ? Foods and drinks with added sugar. ? Caffeine and fruit juice.  Drink enough fluid to  keep your urine pale yellow.  Go to the bathroom frequently. Do not hold your urine. Try to empty your bladder completely.  Change your underwear every day. Wear all-cotton underwear. Do not wear tight underwear or pants. General instructions   Take these steps to lower the risk of bacteria getting into your urinary tract: ? Use liquid soap instead of bar soap when showering or bathing. Bacteria can grow on bar soap. ? When you wash yourself, clean the urethra opening first. Use a washcloth to clean the area between your vagina and anus. Pat the area dry with a clean towel. ? Wash your hands before and after you go to the bathroom. ? Wipe yourself from front to back after going to the bathroom. ? Do not use douches, perfumed soap, creams, or powders. ? Do not soak  in a bath for more than 30 minutes.  Return to your normal activities as told by your health care provider. Ask your health care provider what activities are safe for you.  Keep all follow-up visits as told by your health care provider. This is important. Contact a health care provider if:  You have chills or a fever.  You have any symptoms of infection that do not get better at home.  Symptoms of infection come back.  You have a reaction or side effects from your antibiotic. Get help right away if:  You start having contractions. Summary  Pyelonephritis is an infection of the kidney or kidneys.  This condition results when a bacterial infection in the lower urinary tract spreads to the kidney.  Lower urinary tract infections are common during pregnancy.  Pyelonephritis causes chills, a fever, flank pain, and nausea.  Pyelonephritis is a serious infection that is usually treated in the hospital with IV antibiotics. This information is not intended to replace advice given to you by your health care provider. Make sure you discuss any questions you have with your health care provider. Document Revised: 12/10/2018  Document Reviewed: 11/19/2017 Elsevier Patient Education  2020 [employer] Inc. Pregnancy and Urinary Tract Infection  A urinary tract infection (UTI) is an infection of any part of the urinary tract. This includes the kidneys, the tubes that connect your kidneys to your bladder (ureters), the bladder, and the tube that carries urine out of your body (urethra). These organs make, store, and get rid of urine in the body. Your health care provider may use other names to describe the infection. An upper UTI affects the ureters and kidneys (pyelonephritis). A lower UTI affects the bladder (cystitis) and urethra (urethritis). Most urinary tract infections are caused by bacteria in your genital area, around the entrance to your urinary tract (urethra). These bacteria grow and cause irritation and inflammation of your urinary tract. You are more likely to develop a UTI during pregnancy because the physical and hormonal changes your body goes through can make it easier for bacteria to get into your urinary tract. Your growing baby also puts pressure on your bladder and can affect urine flow. It is important to recognize and treat UTIs in pregnancy because of the risk of serious complications for both you and your baby. How does this affect me? Symptoms of a UTI include:  Needing to urinate right away (urgently).  Frequent urination or passing small amounts of urine frequently.  Pain or burning with urination.  Blood in the urine.  Urine that smells bad or unusual.  Trouble urinating.  Cloudy urine.  Pain in the abdomen or lower back.  Vaginal discharge. You may also have:  Vomiting or a decreased appetite.  Confusion.  Irritability or tiredness.  A fever.  Diarrhea. How does this affect my baby? An untreated UTI during pregnancy could lead to a kidney infection or a systemic infection, which can cause health problems that could affect your baby. Possible complications of an untreated UTI  include:  Giving birth to your baby before 37 weeks of pregnancy (premature).  Having a baby with a low birth weight.  Developing high blood pressure during pregnancy (preeclampsia).  Having a low hemoglobin level (anemia). What can I do to lower my risk? To prevent a UTI:  Go to the bathroom as soon as you feel the need. Do not hold urine for long periods of time.  Always wipe from front to back, especially after a  bowel movement. Use each tissue one time when you wipe.  Empty your bladder after sex.  Keep your genital area dry.  Drink 6-10 glasses of water each day.  Do not douche or use deodorant sprays. How is this treated? Treatment for this condition may include:  Antibiotic medicines that are safe to take during pregnancy.  Other medicines to treat less common causes of UTI. Follow these instructions at home:  If you were prescribed an antibiotic medicine, take it as told by your health care provider. Do not stop using the antibiotic even if you start to feel better.  Keep all follow-up visits as told by your health care provider. This is important. Contact a health care provider if:  Your symptoms do not improve or they get worse.  You have abnormal vaginal discharge. Get help right away if you:  Have a fever.  Have nausea and vomiting.  Have back or side pain.  Feel contractions in your uterus.  Have lower belly pain.  Have a gush of fluid from your vagina.  Have blood in your urine. Summary  A urinary tract infection (UTI) is an infection of any part of the urinary tract, which includes the kidneys, ureters, bladder, and urethra.  Most urinary tract infections are caused by bacteria in your genital area, around the entrance to your urinary tract (urethra).  You are more likely to develop a UTI during pregnancy.  If you were prescribed an antibiotic medicine, take it as told by your health care provider. Do not stop using the antibiotic even if  you start to feel better. This information is not intended to replace advice given to you by your health care provider. Make sure you discuss any questions you have with your health care provider. Document Revised: 12/10/2018 Document Reviewed: 07/22/2018 Elsevier Patient Education  2020 [employer].                  Safe Medications in Pregnancy    Acne: Benzoyl Peroxide Salicylic Acid  Backache/Headache: Tylenol: 2 regular strength every 4 hours OR              2 Extra strength every 6 hours  Colds/Coughs/Allergies: Benadryl (alcohol free) 25 mg every 6 hours as needed Breath right strips Claritin Cepacol throat lozenges Chloraseptic throat spray Cold-Eeze- up to three times per day Cough drops, alcohol free Flonase (by prescription only) Guaifenesin Mucinex Robitussin DM (plain only, alcohol free) Saline nasal spray/drops Sudafed (pseudoephedrine) & Actifed ** use only after [redacted] weeks gestation and if you do not have high blood pressure Tylenol Vicks Vaporub Zinc lozenges Zyrtec   Constipation: Colace Ducolax suppositories Fleet enema Glycerin suppositories Metamucil Milk of magnesia Miralax Senokot Smooth move tea  Diarrhea: Kaopectate Imodium A-D  *NO pepto Bismol  Hemorrhoids: Anusol Anusol HC Preparation H Tucks  Indigestion: Tums Maalox Mylanta Zantac  Pepcid  Insomnia: Benadryl (alcohol free) 25mg  every 6 hours as needed Tylenol PM Unisom, no Gelcaps  Leg Cramps: Tums MagGel  Nausea/Vomiting:  Bonine Dramamine Emetrol Ginger extract Sea bands Meclizine  Nausea medication to take during pregnancy:  Unisom (doxylamine succinate 25 mg tablets) Take one tablet daily at bedtime. If symptoms are not adequately controlled, the dose can be increased to a maximum recommended dose of two tablets daily (1/2 tablet in the morning, 1/2 tablet mid-afternoon and one at bedtime). Vitamin B6 100mg  tablets. Take one tablet twice a day (up to  200 mg per day).  Skin Rashes: Aveeno products Benadryl  cream or 25mg  every 6 hours as needed Calamine Lotion 1% cortisone cream  Yeast infection: Gyne-lotrimin 7 Monistat 7   **If taking multiple medications, please check labels to avoid duplicating the same active ingredients **take medication as directed on the label ** Do not exceed 4000 mg of tylenol in 24 hours **Do not take medications that contain aspirin or ibuprofen

## 2019-10-08 LAB — CULTURE, OB URINE

## 2019-10-12 ENCOUNTER — Other Ambulatory Visit (HOSPITAL_COMMUNITY): Payer: Self-pay | Admitting: Obstetrics and Gynecology

## 2019-10-12 DIAGNOSIS — Z3A30 30 weeks gestation of pregnancy: Secondary | ICD-10-CM

## 2019-10-12 DIAGNOSIS — Z363 Encounter for antenatal screening for malformations: Secondary | ICD-10-CM

## 2019-10-12 DIAGNOSIS — O09523 Supervision of elderly multigravida, third trimester: Secondary | ICD-10-CM

## 2019-10-13 LAB — OB RESULTS CONSOLE HEPATITIS B SURFACE ANTIGEN: Hepatitis B Surface Ag: NEGATIVE

## 2019-10-13 LAB — OB RESULTS CONSOLE GC/CHLAMYDIA
Chlamydia: NEGATIVE
Gonorrhea: NEGATIVE

## 2019-10-13 LAB — OB RESULTS CONSOLE ANTIBODY SCREEN: Antibody Screen: NEGATIVE

## 2019-10-13 LAB — OB RESULTS CONSOLE ABO/RH: RH Type: POSITIVE

## 2019-10-13 LAB — OB RESULTS CONSOLE HIV ANTIBODY (ROUTINE TESTING): HIV: NONREACTIVE

## 2019-10-13 LAB — OB RESULTS CONSOLE GBS: GBS: POSITIVE

## 2019-10-13 LAB — OB RESULTS CONSOLE RPR: RPR: NONREACTIVE

## 2019-10-13 LAB — OB RESULTS CONSOLE RUBELLA ANTIBODY, IGM: Rubella: IMMUNE

## 2019-10-27 ENCOUNTER — Encounter (HOSPITAL_COMMUNITY): Payer: Self-pay

## 2019-10-27 ENCOUNTER — Ambulatory Visit (HOSPITAL_COMMUNITY): Payer: Medicaid Other | Admitting: *Deleted

## 2019-10-27 ENCOUNTER — Other Ambulatory Visit: Payer: Self-pay

## 2019-10-27 ENCOUNTER — Other Ambulatory Visit (HOSPITAL_COMMUNITY): Payer: Self-pay | Admitting: *Deleted

## 2019-10-27 ENCOUNTER — Ambulatory Visit (HOSPITAL_COMMUNITY)
Admission: RE | Admit: 2019-10-27 | Discharge: 2019-10-27 | Disposition: A | Discharge status: Home / Self Care | Payer: Medicaid Other | Source: Ambulatory Visit | Attending: Obstetrics and Gynecology | Admitting: Obstetrics and Gynecology

## 2019-10-27 VITALS — BP 116/63 | HR 96 | Temp 97.7°F

## 2019-10-27 DIAGNOSIS — Z3A30 30 weeks gestation of pregnancy: Secondary | ICD-10-CM

## 2019-10-27 DIAGNOSIS — O09529 Supervision of elderly multigravida, unspecified trimester: Secondary | ICD-10-CM

## 2019-10-27 DIAGNOSIS — Z363 Encounter for antenatal screening for malformations: Secondary | ICD-10-CM | POA: Diagnosis not present

## 2019-10-27 DIAGNOSIS — O09523 Supervision of elderly multigravida, third trimester: Secondary | ICD-10-CM

## 2019-11-03 ENCOUNTER — Inpatient Hospital Stay (HOSPITAL_COMMUNITY)
Admission: AD | Admit: 2019-11-03 | Discharge: 2019-11-03 | Disposition: A | Discharge status: Home / Self Care | Payer: Medicaid Other | Attending: Obstetrics & Gynecology | Admitting: Obstetrics & Gynecology

## 2019-11-03 ENCOUNTER — Inpatient Hospital Stay (HOSPITAL_COMMUNITY): Payer: Medicaid Other

## 2019-11-03 ENCOUNTER — Encounter (HOSPITAL_COMMUNITY): Payer: Self-pay | Admitting: Obstetrics & Gynecology

## 2019-11-03 ENCOUNTER — Other Ambulatory Visit: Payer: Self-pay

## 2019-11-03 DIAGNOSIS — Z3A31 31 weeks gestation of pregnancy: Secondary | ICD-10-CM

## 2019-11-03 DIAGNOSIS — N1330 Unspecified hydronephrosis: Secondary | ICD-10-CM | POA: Diagnosis not present

## 2019-11-03 DIAGNOSIS — M545 Low back pain: Secondary | ICD-10-CM | POA: Insufficient documentation

## 2019-11-03 DIAGNOSIS — O99892 Other specified diseases and conditions complicating childbirth: Secondary | ICD-10-CM | POA: Diagnosis not present

## 2019-11-03 DIAGNOSIS — M549 Dorsalgia, unspecified: Secondary | ICD-10-CM

## 2019-11-03 DIAGNOSIS — O2343 Unspecified infection of urinary tract in pregnancy, third trimester: Secondary | ICD-10-CM | POA: Diagnosis not present

## 2019-11-03 DIAGNOSIS — Z3689 Encounter for other specified antenatal screening: Secondary | ICD-10-CM

## 2019-11-03 LAB — COMPREHENSIVE METABOLIC PANEL
ALT: 12 U/L (ref 0–44)
AST: 16 U/L (ref 15–41)
Albumin: 3.1 g/dL — ABNORMAL LOW (ref 3.5–5.0)
Alkaline Phosphatase: 54 U/L (ref 38–126)
Anion gap: 8 (ref 5–15)
BUN: <5 mg/dL — ABNORMAL LOW (ref 6–20)
CO2: 24 mmol/L (ref 22–32)
Calcium: 8.9 mg/dL (ref 8.9–10.3)
Chloride: 105 mmol/L (ref 98–111)
Creatinine, Ser: 0.47 mg/dL (ref 0.44–1.00)
GFR calc Af Amer: >60 mL/min (ref >60)
GFR calc non Af Amer: >60 mL/min (ref >60)
Glucose, Bld: 72 mg/dL (ref 70–99)
Potassium: 4 mmol/L (ref 3.5–5.1)
Sodium: 137 mmol/L (ref 135–145)
Total Bilirubin: 0.5 mg/dL (ref 0.3–1.2)
Total Protein: 6.1 g/dL — ABNORMAL LOW (ref 6.5–8.1)

## 2019-11-03 LAB — CBC WITH DIFFERENTIAL/PLATELET
Abs Immature Granulocytes: 0.02 10*3/uL (ref 0.00–0.07)
Basophils Absolute: 0 10*3/uL (ref 0.0–0.1)
Basophils Relative: 0 %
Eosinophils Absolute: 0 10*3/uL (ref 0.0–0.5)
Eosinophils Relative: 1 %
HCT: 33.9 % — ABNORMAL LOW (ref 36.0–46.0)
Hemoglobin: 11.3 g/dL — ABNORMAL LOW (ref 12.0–15.0)
Immature Granulocytes: 0 %
Lymphocytes Relative: 21 %
Lymphs Abs: 1.8 10*3/uL (ref 0.7–4.0)
MCH: 31.6 pg (ref 26.0–34.0)
MCHC: 33.3 g/dL (ref 30.0–36.0)
MCV: 94.7 fL (ref 80.0–100.0)
Monocytes Absolute: 0.4 10*3/uL (ref 0.1–1.0)
Monocytes Relative: 5 %
Neutro Abs: 6.3 10*3/uL (ref 1.7–7.7)
Neutrophils Relative %: 73 %
Platelets: 175 10*3/uL (ref 150–400)
RBC: 3.58 MIL/uL — ABNORMAL LOW (ref 3.87–5.11)
RDW: 12.8 % (ref 11.5–15.5)
WBC: 8.6 10*3/uL (ref 4.0–10.5)
nRBC: 0 % (ref 0.0–0.2)

## 2019-11-03 LAB — URINALYSIS, ROUTINE W REFLEX MICROSCOPIC
Bilirubin Urine: NEGATIVE
Glucose, UA: NEGATIVE mg/dL
Hgb urine dipstick: NEGATIVE
Ketones, ur: NEGATIVE mg/dL
Leukocytes,Ua: NEGATIVE
Nitrite: NEGATIVE
Protein, ur: NEGATIVE mg/dL
Specific Gravity, Urine: 1.004 — ABNORMAL LOW (ref 1.005–1.030)
pH: 7 (ref 5.0–8.0)

## 2019-11-03 NOTE — Discharge Instructions (Signed)
Pregnancy and Urinary Tract Infection  A urinary tract infection (UTI) is an infection of any part of the urinary tract. This includes the kidneys, the tubes that connect your kidneys to your bladder (ureters), the bladder, and the tube that carries urine out of your body (urethra). These organs make, store, and get rid of urine in the body. Your health care provider may use other names to describe the infection. An upper UTI affects the ureters and kidneys (pyelonephritis). A lower UTI affects the bladder (cystitis) and urethra (urethritis). Most urinary tract infections are caused by bacteria in your genital area, around the entrance to your urinary tract (urethra). These bacteria grow and cause irritation and inflammation of your urinary tract. You are more likely to develop a UTI during pregnancy because the physical and hormonal changes your body goes through can make it easier for bacteria to get into your urinary tract. Your growing baby also puts pressure on your bladder and can affect urine flow. It is important to recognize and treat UTIs in pregnancy because of the risk of serious complications for both you and your baby. How does this affect me? Symptoms of a UTI include:  Needing to urinate right away (urgently).  Frequent urination or passing small amounts of urine frequently.  Pain or burning with urination.  Blood in the urine.  Urine that smells bad or unusual.  Trouble urinating.  Cloudy urine.  Pain in the abdomen or lower back.  Vaginal discharge. You may also have:  Vomiting or a decreased appetite.  Confusion.  Irritability or tiredness.  A fever.  Diarrhea. How does this affect my baby? An untreated UTI during pregnancy could lead to a kidney infection or a systemic infection, which can cause health problems that could affect your baby. Possible complications of an untreated UTI include:  Giving birth to your baby before 37 weeks of pregnancy  (premature).  Having a baby with a low birth weight.  Developing high blood pressure during pregnancy (preeclampsia).  Having a low hemoglobin level (anemia). What can I do to lower my risk? To prevent a UTI:  Go to the bathroom as soon as you feel the need. Do not hold urine for long periods of time.  Always wipe from front to back, especially after a bowel movement. Use each tissue one time when you wipe.  Empty your bladder after sex.  Keep your genital area dry.  Drink 6-10 glasses of water each day.  Do not douche or use deodorant sprays. How is this treated? Treatment for this condition may include:  Antibiotic medicines that are safe to take during pregnancy.  Other medicines to treat less common causes of UTI. Follow these instructions at home:  If you were prescribed an antibiotic medicine, take it as told by your health care provider. Do not stop using the antibiotic even if you start to feel better.  Keep all follow-up visits as told by your health care provider. This is important. Contact a health care provider if:  Your symptoms do not improve or they get worse.  You have abnormal vaginal discharge. Get help right away if you:  Have a fever.  Have nausea and vomiting.  Have back or side pain.  Feel contractions in your uterus.  Have lower belly pain.  Have a gush of fluid from your vagina.  Have blood in your urine. Summary  A urinary tract infection (UTI) is an infection of any part of the urinary tract, which includes the   kidneys, ureters, bladder, and urethra.  Most urinary tract infections are caused by bacteria in your genital area, around the entrance to your urinary tract (urethra).  You are more likely to develop a UTI during pregnancy.  If you were prescribed an antibiotic medicine, take it as told by your health care provider. Do not stop using the antibiotic even if you start to feel better. This information is not intended to  replace advice given to you by your health care provider. Make sure you discuss any questions you have with your health care provider. Document Revised: 12/10/2018 Document Reviewed: 07/22/2018 Elsevier Patient Education  2020 [employer] Pyelonephritis During Pregnancy  What are the causes? This condition is caused by a bacterial infection in the lower urinary tract that spreads to the kidney. What increases the risk? You are more likely to develop this condition if:  You have diabetes.  You have a history of frequent urinary tract infections. What are the signs or symptoms? Symptoms of this condition may begin with symptoms of a lower urinary tract infection. These may include:  A frequent urge to pass urine.  Burning pain when passing urine.  Pain and pressure in your lower abdomen.  Blood in your urine.  Cloudy or smelly urine. As the infection spreads to your kidney, you may have these symptoms:  Fever.  Chills.  Pain and tenderness in your upper abdomen or in your back and sides (flank pain). Flank pain often affects one side of the body, usually the right side.  Nausea, vomiting, or loss of appetite. How is this diagnosed? This condition may be diagnosed based on:  Your symptoms and medical history.  A physical exam.  Tests to confirm the diagnosis. These may include: ? Blood tests to check kidney function and look for signs of infection. ? Urine tests to check for signs of infection, including bacteria, white or red blood cells, and protein. ? Tests to grow and identify the type of bacteria that is causing the infection (urine culture). ? Imaging studies of your kidneys to learn more about your condition. How is this treated? This condition is treated in the hospital with antibiotics that are given into one of your veins through an IV. Your health care provider:  Will start you on an antibiotic that is effective against common urinary tract  infections.  May switch to another antibiotic if the results of your urine culture show that your infection is caused by different bacteria.  Will be careful to choose antibiotics that are the safest during pregnancy. Other treatments may include:  IV fluids if you are nauseous and not able to drink fluids.  Pain and fever medicines.  Medicines for nausea and vomiting. You will be able to go home when your infection is under control. To prevent another infection, you may need to continue taking antibiotics by mouth until your baby is born. Follow these instructions at home: Medicines   Take over-the-counter and prescription medicines only as told by your health care provider.  Take your antibiotic medicine as told by your health care provider. Do not stop taking the antibiotic even if you start to feel better.  Continue to take your prenatal vitamins. Lifestyle   Follow instructions from your health care provider about eating or drinking restrictions. You may need to avoid: ? Foods and drinks with added sugar. ? Caffeine and fruit juice.  Drink enough fluid to keep your urine pale yellow.  Go to the bathroom frequently. Do not  hold your urine. Try to empty your bladder completely.  Change your underwear every day. Wear all-cotton underwear. Do not wear tight underwear or pants. General instructions   Take these steps to lower the risk of bacteria getting into your urinary tract: ? Use liquid soap instead of bar soap when showering or bathing. Bacteria can grow on bar soap. ? When you wash yourself, clean the urethra opening first. Use a washcloth to clean the area between your vagina and anus. Pat the area dry with a clean towel. ? Wash your hands before and after you go to the bathroom. ? Wipe yourself from front to back after going to the bathroom. ? Do not use douches, perfumed soap, creams, or powders. ? Do not soak in a bath for more than 30 minutes.  Return to your  normal activities as told by your health care provider. Ask your health care provider what activities are safe for you.  Keep all follow-up visits as told by your health care provider. This is important. Contact a health care provider if:  You have chills or a fever.  You have any symptoms of infection that do not get better at home.  Symptoms of infection come back.  You have a reaction or side effects from your antibiotic. Get help right away if:  You start having contractions. Summary  Pyelonephritis is an infection of the kidney or kidneys.  This condition results when a bacterial infection in the lower urinary tract spreads to the kidney.  Lower urinary tract infections are common during pregnancy.  Pyelonephritis causes chills, a fever, flank pain, and nausea.  Pyelonephritis is a serious infection that is usually treated in the hospital with IV antibiotics. This information is not intended to replace advice given to you by your health care provider. Make sure you discuss any questions you have with your health care provider. Document Revised: 12/10/2018 Document Reviewed: 11/19/2017 Elsevier Patient Education  [employer]. Abdominal Pain During Pregnancy  Abdominal pain is common during pregnancy, and has many possible causes. Some causes are more serious than others, and sometimes the cause is not known. Abdominal pain can be a sign that labor is starting. It can also be caused by normal growth and stretching of muscles and ligaments during pregnancy. Always tell your health care provider if you have any abdominal pain. Follow these instructions at home:  Do not have sex or put anything in your vagina until your pain goes away completely.  Get plenty of rest until your pain improves.  Drink enough fluid to keep your urine pale yellow.  Take over-the-counter and prescription medicines only as told by your health care provider.  Keep all follow-up visits as told  by your health care provider. This is important. Contact a health care provider if:  Your pain continues or gets worse after resting.  You have lower abdominal pain that: ? Comes and goes at regular intervals. ? Spreads to your back. ? Is similar to menstrual cramps.  You have pain or burning when you urinate. Get help right away if:  You have a fever or chills.  You have vaginal bleeding.  You are leaking fluid from your vagina.  You are passing tissue from your vagina.  You have vomiting or diarrhea that lasts for more than 24 hours.  Your baby is moving less than usual.  You feel very weak or faint.  You have shortness of breath.  You develop severe pain in your upper abdomen.  Summary  Abdominal pain is common during pregnancy, and has many possible causes.  If you experience abdominal pain during pregnancy, tell your health care provider right away.  Follow your health care provider's home care instructions and keep all follow-up visits as directed. This information is not intended to replace advice given to you by your health care provider. Make sure you discuss any questions you have with your health care provider. Document Revised: 12/06/2018 Document Reviewed: 11/20/2016 Elsevier Patient Education  2020 [employer].  Hydronephrosis  Hydronephrosis is the swelling of one or both kidneys due to a blockage that stops urine from flowing out of the body. Kidneys filter waste from the blood and produce urine. This condition can lead to kidney failure and may become life threatening if not treated promptly. What are the causes? Common causes of this condition include:  Problems that occur when a baby is developing in the womb (congenital defect). These can include problems: ? In the kidneys. ? In the tubes that drain urine from the kidneys into the bladder (ureters).  Kidney stones.  Bladder infection.  An enlarged prostate gland.  Scar tissue from a  previous surgery or injury.  A blood clot.  A tumor or cyst in the abdomen or pelvis.  Cancer of the prostate, bladder, uterus, ovary, or colon. What are the signs or symptoms? Symptoms of this condition include:  Pain or discomfort in your side (flank).  Pain and swelling in your abdomen.  Nausea and vomiting.  Fever.  Pain when passing urine.  Feelings of urgency when you need to urinate.  Urinating more often than normal. In some cases, you may not have any symptoms. How is this diagnosed? This condition may be diagnosed based on:  Your symptoms and medical history.  A physical exam.  Blood and urine tests.  Imaging tests, such as an ultrasound, CT scan, or MRI.  A procedure in which a scope is inserted into the urethra and used to view parts of the urinary tract and bladder (cystoscopy). How is this treated? Treatment for this condition depends on where the blockage is, how long it has been there, and what caused it. The goal of treatment is to remove the blockage. Treatment may include:  Antibiotic medicines to treat or prevent infection.  A procedure to place a small, thin tube (stent) into a blocked ureter. The stent will keep the ureter open so that urine can drain through it.  A nonsurgical procedure that crushes kidney stones with shock waves (extracorporeal shock wave lithotripsy).  If kidney failure occurs, treatment may include dialysis or a kidney transplant. Follow these instructions at home:   Take over-the-counter and prescription medicines only as told by your health care provider.  Rest and return to your normal activities as told by your health care provider. Ask your health care provider what activities are safe for you.  Drink enough fluid to keep your urine pale yellow.  If you were prescribed an antibiotic medicine, take it exactly as told by your health care provider. Do not stop taking the antibiotic even if you start to feel  better.  Keep all follow-up visits as told by your health care provider. This is important. Contact a health care provider if:  You continue to have symptoms after treatment.  You develop new symptoms.  Your urine becomes cloudy or bloody.  You have a fever. Get help right away if:  You have severe flank or abdominal pain.  You cannot drink  fluids without vomiting. Summary  Hydronephrosis is the swelling of one or both kidneys due to a blockage that stops urine from flowing out of the body.  Hydronephrosis can lead to kidney failure and may become life threatening if not treated promptly.  The goal of treatment is to treat the cause of the blockage. It may include insertion of stent into a blocked ureter, a procedure to treat kidney stones, and antibiotic medicines.  Follow your health care provider's instructions for taking care of yourself at home, including instructions about drinking fluids, taking medicines, and limiting activities. This information is not intended to replace advice given to you by your health care provider. Make sure you discuss any questions you have with your health care provider. Document Revised: 08/29/2017 Document Reviewed: 08/29/2017 Elsevier Patient Education  2020 [employer].  Preterm Labor and Birth Information  The normal length of a pregnancy is 39-41 weeks. Preterm labor is when labor starts before 37 completed weeks of pregnancy. What are the risk factors for preterm labor? Preterm labor is more likely to occur in women who:  Have certain infections during pregnancy such as a bladder infection, sexually transmitted infection, or infection inside the uterus (chorioamnionitis).  Have a shorter-than-normal cervix.  Have gone into preterm labor before.  Have had surgery on their cervix.  Are younger than age 65 or older than age 69.  Are African American.  Are pregnant with twins or multiple babies (multiple gestation).  Take  street drugs or smoke while pregnant.  Do not gain enough weight while pregnant.  Became pregnant shortly after having been pregnant. What are the symptoms of preterm labor? Symptoms of preterm labor include:  Cramps similar to those that can happen during a menstrual period. The cramps may happen with diarrhea.  Pain in the abdomen or lower back.  Regular uterine contractions that may feel like tightening of the abdomen.  A feeling of increased pressure in the pelvis.  Increased watery or bloody mucus discharge from the vagina.  Water breaking (ruptured amniotic sac). Why is it important to recognize signs of preterm labor? It is important to recognize signs of preterm labor because babies who are born prematurely may not be fully developed. This can put them at an increased risk for:  Long-term (chronic) heart and lung problems.  Difficulty immediately after birth with regulating body systems, including blood sugar, body temperature, heart rate, and breathing rate.  Bleeding in the brain.  Cerebral palsy.  Learning difficulties.  Death. These risks are highest for babies who are born before 34 weeks of pregnancy. How is preterm labor treated? Treatment depends on the length of your pregnancy, your condition, and the health of your baby. It may involve:  Having a stitch (suture) placed in your cervix to prevent your cervix from opening too early (cerclage).  Taking or being given medicines, such as: ? Hormone medicines. These may be given early in pregnancy to help support the pregnancy. ? Medicine to stop contractions. ? Medicines to help mature the baby's lungs. These may be prescribed if the risk of delivery is high. ? Medicines to prevent your baby from developing cerebral palsy. If the labor happens before 34 weeks of pregnancy, you may need to stay in the hospital. What should I do if I think I am in preterm labor? If you think that you are going into preterm  labor, call your health care provider right away. How can I prevent preterm labor in future pregnancies? To increase  your chance of having a full-term pregnancy:  Do not use any tobacco products, such as cigarettes, chewing tobacco, and e-cigarettes. If you need help quitting, ask your health care provider.  Do not use street drugs or medicines that have not been prescribed to you during your pregnancy.  Talk with your health care provider before taking any herbal supplements, even if you have been taking them regularly.  Make sure you gain a healthy amount of weight during your pregnancy.  Watch for infection. If you think that you might have an infection, get it checked right away.  Make sure to tell your health care provider if you have gone into preterm labor before. This information is not intended to replace advice given to you by your health care provider. Make sure you discuss any questions you have with your health care provider. Document Revised: 12/10/2018 Document Reviewed: 01/09/2016 Elsevier Patient Education  2020 [employer].

## 2019-11-03 NOTE — MAU Provider Note (Signed)
History     CSN: [id number]  Arrival date and time: 11/03/19 1356   First Provider Initiated Contact with Patient 11/03/19 1913      Chief Complaint  Patient presents with  . Back Pain   [name] is a 34 y.o. [id number] at [redacted]w[redacted]d who presents to MAU for low back pain that radiated up to right flank area. Pt reports she is on Penicillin VK per her OB for a UTI. Pt reports recurrent UTIs this pregnancy and is currently being treated for her second UTI. Pt denies any intercourse since before January 2021.  Onset: Sunday 10/30/2019  Location: LBP, right flank Duration: 5days Character: dull, sharp Aggravating/Associated: lying, sitting for extended periods/none Relieving: heating pad Treatment: Tylenol 1000mg  - helped Severity: 7/10  Pt denies VB, LOF, ctx, decreased FM, vaginal discharge/odor/itching. Pt denies N/V, abdominal pain, constipation, diarrhea, or urinary problems. Pt denies fever, chills, fatigue, sweating or changes in appetite. Pt denies SOB or chest pain. Pt denies dizziness, HA, light-headedness, weakness.  Problems this pregnancy include: none. Allergies? Ceclor, Tdap, Vancomycin Current medications/supplements? Tylenol PRN at night, PCN VK, Probiotic, PNV Prenatal care provider? [facility], next appt 11/09/2019   OB History    Gravida  5   Para  4   Term  4   Preterm  0   AB  0   Living  4     SAB  0   TAB  0   Ectopic  0   Multiple  0   Live Births  4           Past Medical History:  Diagnosis Date  . Bacterial infection   . Female bladder prolapse   . FHx: asthma   . FHx: cancer   . FHx: diabetes mellitus   . FHx: heart disease   . FHx: hypertension   . GBS carrier   . H/O varicella   . Infection    UTI  . Urinary tract infection   . Yeast infection     Past Surgical History:  Procedure Laterality Date  . WISDOM TOOTH EXTRACTION      Family History  Problem Relation Age of Onset  . Hypertension Mother   . Birth  defects Mother        extra digit  . Diabetes Maternal Grandfather   . Hypertension Maternal Grandfather   . Cancer Paternal Grandmother        lymphoma  . Heart disease Paternal Grandfather   . Other Cousin        reaction to vaccines  . Anesthesia problems Neg Hx     Social History   Tobacco Use  . Smoking status: Never Smoker  . Smokeless tobacco: Never Used  Substance Use Topics  . Alcohol use: No  . Drug use: No    Allergies:  Allergies  Allergen Reactions  . Ceclor [Cefaclor] Hives  . Tdap [Tetanus-Diphth-Acell Pertussis] Swelling    Severe leg swelling  . Vancomycin Other (See Comments)    Red man reaction    Medications Prior to Admission  Medication Sig Dispense Refill Last Dose  . miconazole (MICOTIN) 100 MG vaginal suppository Place 100 mg vaginally at bedtime.   Past Week at Unknown time  . penicillin G potassium 5 Million Units in dextrose 5 % 250 mL Inject 5 Million Units into the vein every 4 (four) hours.     . Prenatal Vit-Fe Fumarate-FA (PRENATAL MULTIVITAMIN) TABS Take 1 tablet by mouth daily.  11/03/2019 at Unknown time  . Cod Liver Oil (COD LIVER PO) Take 1 capsule by mouth daily.       Review of Systems  Constitutional: Negative for chills, diaphoresis, fatigue and fever.  Eyes: Negative for visual disturbance.  Respiratory: Negative for shortness of breath.   Cardiovascular: Negative for chest pain.  Gastrointestinal: Negative for abdominal pain, constipation, diarrhea, nausea and vomiting.  Genitourinary: Negative for dysuria, flank pain, frequency, pelvic pain, urgency, vaginal bleeding and vaginal discharge.  Musculoskeletal: Positive for back pain.  Neurological: Negative for dizziness, weakness, light-headedness and headaches.   Physical Exam   Blood pressure (!) 101/57, pulse 90, temperature 98.6 F (37 C), resp. rate 12, height 5\' 5"  (1.651 m), weight 63.8 kg, SpO2 96 %, unknown if currently breastfeeding.  Patient Vitals for the  past 24 hrs:  BP Temp Temp src Pulse Resp SpO2 Height Weight  11/03/19 1950 (!) 101/57 98.6 F (37 C) -- 90 12 96 % -- --  11/03/19 1417 129/64 98.4 F (36.9 C) Oral 96 20 97 % 5\' 5"  (1.651 m) 63.8 kg   Physical Exam  Constitutional: She is oriented to person, place, and time. She appears well-developed and well-nourished. No distress.  HENT:  Head: Normocephalic and atraumatic.  Respiratory: Effort normal.  GI: Soft. She exhibits no distension and no mass. There is no abdominal tenderness. There is no rebound, no guarding and no CVA tenderness.  Genitourinary:    No vaginal discharge.   Neurological: She is alert and oriented to person, place, and time.  Skin: Skin is warm and dry. She is not diaphoretic.  Psychiatric: She has a normal mood and affect. Her behavior is normal. Judgment and thought content normal.   Results for orders placed or performed during the hospital encounter of 11/03/19 (from the past 24 hour(s))  CBC with Differential/Platelet     Status: Abnormal   Collection Time: 11/03/19  4:08 PM  Result Value Ref Range   WBC 8.6 4.0 - 10.5 K/uL   RBC 3.58 (L) 3.87 - 5.11 MIL/uL   Hemoglobin 11.3 (L) 12.0 - 15.0 g/dL   HCT 33.9 (L) 36.0 - 46.0 %   MCV 94.7 80.0 - 100.0 fL   MCH 31.6 26.0 - 34.0 pg   MCHC 33.3 30.0 - 36.0 g/dL   RDW 12.8 11.5 - 15.5 %   Platelets 175 150 - 400 K/uL   nRBC 0.0 0.0 - 0.2 %   Neutrophils Relative % 73 %   Neutro Abs 6.3 1.7 - 7.7 K/uL   Lymphocytes Relative 21 %   Lymphs Abs 1.8 0.7 - 4.0 K/uL   Monocytes Relative 5 %   Monocytes Absolute 0.4 0.1 - 1.0 K/uL   Eosinophils Relative 1 %   Eosinophils Absolute 0.0 0.0 - 0.5 K/uL   Basophils Relative 0 %   Basophils Absolute 0.0 0.0 - 0.1 K/uL   Immature Granulocytes 0 %   Abs Immature Granulocytes 0.02 0.00 - 0.07 K/uL  Comprehensive metabolic panel     Status: Abnormal   Collection Time: 11/03/19  4:08 PM  Result Value Ref Range   Sodium 137 135 - 145 mmol/L   Potassium 4.0 3.5  - 5.1 mmol/L   Chloride 105 98 - 111 mmol/L   CO2 24 22 - 32 mmol/L   Glucose, Bld 72 70 - 99 mg/dL   BUN <5 (L) 6 - 20 mg/dL   Creatinine, Ser 0.47 0.44 - 1.00 mg/dL   Calcium 8.9 8.9 -  10.3 mg/dL   Total Protein 6.1 (L) 6.5 - 8.1 g/dL   Albumin 3.1 (L) 3.5 - 5.0 g/dL   AST 16 15 - 41 U/L   ALT 12 0 - 44 U/L   Alkaline Phosphatase 54 38 - 126 U/L   Total Bilirubin 0.5 0.3 - 1.2 mg/dL   GFR calc non Af Amer >60 >60 mL/min   GFR calc Af Amer >60 >60 mL/min   Anion gap 8 5 - 15  Urinalysis, Routine w reflex microscopic     Status: Abnormal   Collection Time: 11/03/19  7:17 PM  Result Value Ref Range   Color, Urine STRAW (A) YELLOW   APPearance CLEAR CLEAR   Specific Gravity, Urine 1.004 (L) 1.005 - 1.030   pH 7.0 5.0 - 8.0   Glucose, UA NEGATIVE NEGATIVE mg/dL   Hgb urine dipstick NEGATIVE NEGATIVE   Bilirubin Urine NEGATIVE NEGATIVE   Ketones, ur NEGATIVE NEGATIVE mg/dL   Protein, ur NEGATIVE NEGATIVE mg/dL   Nitrite NEGATIVE NEGATIVE   Leukocytes,Ua NEGATIVE NEGATIVE   US RENAL  Result Date: 11/03/2019 CLINICAL DATA:  34 year old pregnant female with flank pain. EXAM: RENAL / URINARY TRACT ULTRASOUND COMPLETE COMPARISON:  None. FINDINGS: Right Kidney: Renal measurements: 10.2 x 4.0 x 5.4 cm = volume: 115 mL. Normal echogenicity. Mild hydronephrosis. No shadowing stone. Left Kidney: Renal measurements: 10.6 x 5.6 x 4.7 cm = volume: 148 mL. Mild hydronephrosis. Normal echogenicity. No shadowing stone. Bladder: The urinary bladder is grossly unremarkable the degree of distention. Bilateral ureteral jets noted. Artifact versus bladder debris. Correlation with urinalysis recommended. Other: Partially visualized fetal head. IMPRESSION: Mild bilateral hydronephrosis. Electronically Signed   By: Elgie CollardArash  Radparvar M.D.   On: 11/03/2019 17:05   US MFM OB DETAIL +14 WK  Result Date: 10/27/2019 ----------------------------------------------------------------------  OBSTETRICS REPORT                        (Signed Final 10/27/2019 06:03 pm) ---------------------------------------------------------------------- Patient Info  ID #:       [id number]                          D.O.B.:  Jan 16, 1986 (33 yrs)  Name:       Jamison NeighborALISON D Braun                  Visit Date: 10/27/2019 02:45 pm ---------------------------------------------------------------------- Performed By  Performed By:     Tomma Lightningevin Vics             Ref. Address:     [facility]                    RDMS,RVT                                                             Obstetrics &                                                             Gynecology  [address]                                                             [address]                                                             [place], [place]                                                             [id number]  Attending:        Noralee Space MD        Location[facility]  Referred By:      Osborn Coho MD ---------------------------------------------------------------------- Orders   #  Description                          Code         Ordered By   1  [place] MFM OB DETAIL +14 WK              86168.37     Osborn Coho  ----------------------------------------------------------------------   #  Order #                    Accession #                 Episode #   1  [id number]                  [id number]                  [id number]  ---------------------------------------------------------------------- Indications   Advanced maternal age multigravida 57+,        O63.522   second trimester   [redacted] weeks gestation of pregnancy                Z3A.30   Encounter for antenatal screening for          Z36.3   malformations   ----------------------------------------------------------------------  Fetal Evaluation  Num Of Fetuses:         1  Fetal Heart Rate(bpm):  157  Cardiac Activity:       Observed  Presentation:           Cephalic  Placenta:               Posterior  P. Cord Insertion:      Not well visualized  Amniotic Fluid  AFI FV:      Within normal limits  AFI Sum(cm)     %Tile       Largest Pocket(cm)  17.07           63          6.8  RUQ(cm)       RLQ(cm)       LUQ(cm)        LLQ(cm)  6.12          3.06          6.8            1.09 ---------------------------------------------------------------------- Biometry  BPD:      76.1  mm     G. Age:  30w 4d         29  %    CI:        75.94   %    70 - 86                                                          FL/HC:      20.4   %    19.3 - 21.3  HC:      276.8  mm     G. Age:  30w 2d          7  %    HC/AC:      1.05        0.96 - 1.17  AC:      263.5  mm     G. Age:  30w 3d         35  %    FL/BPD:     74.4   %    71 - 87  FL:       56.6  mm     G. Age:  29w 5d         11  %    FL/AC:      21.5   %    20 - 24  HUM:      51.1  mm     G. Age:  30w 0d         32  %  LV:        3.7  mm  Est. FW:    1533  gm      3 lb 6 oz     19  % ---------------------------------------------------------------------- OB History  Gravidity:    5         Term:   4  Living:       4 ---------------------------------------------------------------------- Gestational Age  U/S Today:     30w 2d  EDD:   01/03/20  Best:          30w 6d     Det. By:  Marcella Dubs         EDD:   12/30/19                                      (06/03/19) ---------------------------------------------------------------------- Anatomy  Cranium:               Appears normal         LVOT:                   Appears normal  Cavum:                 Appears normal         Aortic Arch:            Appears normal  Ventricles:            Appears normal         Ductal Arch:            Not well visualized   Choroid Plexus:        Appears normal         Diaphragm:              Appears normal  Cerebellum:            Appears normal         Stomach:                Appears normal, left                                                                        sided  Posterior Fossa:       Appears normal         Abdomen:                Appears normal  Nuchal Fold:           Not applicable (>20    Abdominal Wall:         Not well visualized                         wks GA)  Face:                  Appears normal         Cord Vessels:           Appears normal (3                         (orbits and profile)                           vessel cord)  Lips:                  Appears normal         Kidneys:                Appear normal  Palate:                Not well visualized    Bladder:                Appears normal  Thoracic:              Appears normal         Spine:                  Limited views                                                                        appear normal  Heart:                 Appears normal         Upper Extremities:      LUE visulalized;                         (4CH, axis, and                                RUE not well vis                         situs)  RVOT:                  Not well visualized    Lower Extremities:      Appears normal  Other:  Female gender Heels visualized. Hands not well visualized.  Nasal          bone visualized. Technically difficult due to advanced GA and fetal          position. ---------------------------------------------------------------------- Cervix Uterus Adnexa  Cervix  Not visualized (advanced GA >24wks) ---------------------------------------------------------------------- Impression  Patient initiated her prenatal care at [facility]  because of insurance reasons.  She strongly desires to be  delivered at [facility], [place] by your  midwives/obstetricians.  She reports on cell free fetal DNA screening, the risks of fetal  aneuploidies were not  increased.  She does not have gestational diabetes and reports no  chronic medical conditions.  Obstetric history significant for  previous 4 term deliveries.  Amniotic fluid is normal and good fetal activity is seen. Fetal  growth is appropriate for gestational age. Fetal anatomical  survey is limited because of advanced gestational age, but  appears normal.. ---------------------------------------------------------------------- Recommendations  -An appointment was made for her to return in 4 weeks for  fetal growth assessment (inadequate prenatal care). ----------------------------------------------------------------------                  Noralee Space, MD Electronically Signed Final Report   10/27/2019 06:03 pm ----------------------------------------------------------------------  MAU Course  Procedures  MDM -r/o pyelonephritis -no CVA tenderness -UA: straw/SG 1.004, urine culture sent -CBC: WNL, WBCs 8.3 -CMP: WNL -Renal US: mild bilateral hydronephrosis -EFM: reactive       -baseline: 135       -variability: moderate       -accels: present, 15x15       -decels: absent       -  TOCO: few ctx -CE: 0/thick (fFN collected but discarded as cervix unchanged from exam in February) -consulted with Dr. Marice Potterove, pt OK to be discharged home and managed outpatient -pt discharged to home in stable condition  Orders Placed This Encounter  Procedures  . Culture, OB Urine    Standing Status:   Standing    Number of Occurrences:   1  . US RENAL    Standing Status:   Standing    Number of Occurrences:   1    Order Specific Question:   Symptom/Reason for Exam    Answer:   Costovertebral angle tenderness [[id number]][[id number]]  . CBC with Differential/Platelet    Standing Status:   Standing    Number of Occurrences:   1  . Comprehensive metabolic panel    Standing Status:   Standing    Number of Occurrences:   1  . Urinalysis, Routine w reflex microscopic    Standing Status:   Standing    Number of  Occurrences:   1  . Discharge patient    Order Specific Question:   Discharge disposition    Answer:   01-Home or Self Care [1]    Order Specific Question:   Discharge patient date    Answer:   11/03/2019   No orders of the defined types were placed in this encounter.   Assessment and Plan   1. Hydronephrosis determined by ultrasound   2. Costovertebral angle tenderness   3. [redacted] weeks gestation of pregnancy   4. NST (non-stress test) reactive    Allergies as of 11/03/2019      Reactions   Ceclor [cefaclor] Hives   Tdap [tetanus-diphth-acell Pertussis] Swelling   Severe leg swelling   Vancomycin Other (See Comments)   Red man reaction      Medication List    TAKE these medications   COD LIVER PO Take 1 capsule by mouth daily.   miconazole 100 MG vaginal suppository Commonly known as: MICOTIN Place 100 mg vaginally at bedtime.   penicillin G potassium 5 Million Units in dextrose 5 % 250 mL Inject 5 Million Units into the vein every 4 (four) hours.   prenatal multivitamin Tabs tablet Take 1 tablet by mouth daily.      -pt declines RX for Flexeril, will discuss with OB at visit next week -discussed hydronephrosis in pregnancy -discussed s/sx of pyelonephritis -return MAU precautions given -pt discharged to home in stable condition  Joni Reiningicole E Charlene Cowdrey 11/03/2019, 7:59 PM

## 2019-11-03 NOTE — MAU Note (Signed)
Started  on antibiotics on Mon for UTI-2nd one since mid Jan.  Started having pain in low back on rt side on Saturday, now moving up side. No fever, just fatigue.

## 2019-11-04 LAB — CULTURE, OB URINE: Culture: <10000 — AB

## 2019-11-24 ENCOUNTER — Ambulatory Visit (HOSPITAL_COMMUNITY)
Admission: RE | Admit: 2019-11-24 | Discharge: 2019-11-24 | Disposition: A | Discharge status: Home / Self Care | Payer: Medicaid Other | Source: Ambulatory Visit | Attending: Obstetrics and Gynecology | Admitting: Obstetrics and Gynecology

## 2019-11-24 ENCOUNTER — Other Ambulatory Visit: Payer: Self-pay

## 2019-11-24 ENCOUNTER — Encounter (HOSPITAL_COMMUNITY): Payer: Self-pay

## 2019-11-24 ENCOUNTER — Ambulatory Visit (HOSPITAL_COMMUNITY): Payer: Medicaid Other | Admitting: *Deleted

## 2019-11-24 VITALS — BP 121/65 | HR 87 | Temp 97.5°F

## 2019-11-24 DIAGNOSIS — O09529 Supervision of elderly multigravida, unspecified trimester: Secondary | ICD-10-CM

## 2019-11-24 DIAGNOSIS — Z362 Encounter for other antenatal screening follow-up: Secondary | ICD-10-CM | POA: Diagnosis not present

## 2019-11-24 DIAGNOSIS — Z3A34 34 weeks gestation of pregnancy: Secondary | ICD-10-CM

## 2019-11-24 DIAGNOSIS — O09523 Supervision of elderly multigravida, third trimester: Secondary | ICD-10-CM | POA: Diagnosis not present

## 2019-12-30 ENCOUNTER — Telehealth (HOSPITAL_COMMUNITY): Payer: Self-pay | Admitting: *Deleted

## 2019-12-30 NOTE — Telephone Encounter (Signed)
Preadmission screen  

## 2020-01-02 ENCOUNTER — Encounter (HOSPITAL_COMMUNITY): Payer: Self-pay | Admitting: Obstetrics and Gynecology

## 2020-01-02 ENCOUNTER — Inpatient Hospital Stay (HOSPITAL_COMMUNITY)
Admission: AD | Admit: 2020-01-02 | Discharge: 2020-01-03 | DRG: 807 | Disposition: A | Discharge status: Home / Self Care | Payer: Medicaid Other | Attending: Obstetrics and Gynecology | Admitting: Obstetrics and Gynecology

## 2020-01-02 ENCOUNTER — Other Ambulatory Visit: Payer: Self-pay

## 2020-01-02 DIAGNOSIS — Z3A40 40 weeks gestation of pregnancy: Secondary | ICD-10-CM

## 2020-01-02 DIAGNOSIS — O479 False labor, unspecified: Secondary | ICD-10-CM | POA: Diagnosis present

## 2020-01-02 DIAGNOSIS — Z20822 Contact with and (suspected) exposure to covid-19: Secondary | ICD-10-CM | POA: Diagnosis present

## 2020-01-02 DIAGNOSIS — O99824 Streptococcus B carrier state complicating childbirth: Secondary | ICD-10-CM | POA: Diagnosis present

## 2020-01-02 DIAGNOSIS — B951 Streptococcus, group B, as the cause of diseases classified elsewhere: Secondary | ICD-10-CM | POA: Insufficient documentation

## 2020-01-02 DIAGNOSIS — Z87440 Personal history of urinary (tract) infections: Secondary | ICD-10-CM | POA: Diagnosis not present

## 2020-01-02 DIAGNOSIS — O4292 Full-term premature rupture of membranes, unspecified as to length of time between rupture and onset of labor: Secondary | ICD-10-CM | POA: Diagnosis present

## 2020-01-02 LAB — POCT FERN TEST: POCT Fern Test: POSITIVE — AB

## 2020-01-02 LAB — RPR: RPR Ser Ql: NONREACTIVE

## 2020-01-02 LAB — TYPE AND SCREEN
ABO/RH(D): A POS
Antibody Screen: NEGATIVE

## 2020-01-02 LAB — CBC
HCT: 36.6 % (ref 36.0–46.0)
Hemoglobin: 12.2 g/dL (ref 12.0–15.0)
MCH: 32 pg (ref 26.0–34.0)
MCHC: 33.3 g/dL (ref 30.0–36.0)
MCV: 96.1 fL (ref 80.0–100.0)
Platelets: 172 10*3/uL (ref 150–400)
RBC: 3.81 MIL/uL — ABNORMAL LOW (ref 3.87–5.11)
RDW: 13.1 % (ref 11.5–15.5)
WBC: 6.9 10*3/uL (ref 4.0–10.5)
nRBC: 0 % (ref 0.0–0.2)

## 2020-01-02 LAB — RESPIRATORY PANEL BY RT PCR (FLU A&B, COVID)
Influenza A by PCR: NEGATIVE
Influenza B by PCR: NEGATIVE
SARS Coronavirus 2 by RT PCR: NEGATIVE

## 2020-01-02 LAB — ABO/RH: ABO/RH(D): A POS

## 2020-01-02 MED ORDER — LIDOCAINE HCL (PF) 1 % IJ SOLN
30.0000 mL | INTRAMUSCULAR | Status: AC | PRN
  Administered 2020-01-02: 30 mL via SUBCUTANEOUS
  Filled 2020-01-02: qty 30

## 2020-01-02 MED ORDER — PENICILLIN G POT IN DEXTROSE 60000 UNIT/ML IV SOLN
3.0000 10*6.[IU] | INTRAVENOUS | Status: DC
  Administered 2020-01-02 (×2): 3 10*6.[IU] via INTRAVENOUS
  Filled 2020-01-02 (×4): qty 50

## 2020-01-02 MED ORDER — IBUPROFEN 600 MG PO TABS
600.0000 mg | ORAL_TABLET | Freq: Four times a day (QID) | ORAL | Status: DC
  Administered 2020-01-02 – 2020-01-03 (×4): 600 mg via ORAL
  Filled 2020-01-02 (×4): qty 1

## 2020-01-02 MED ORDER — COCONUT OIL OIL: 1.0000 "application " | TOPICAL_OIL | Status: DC | PRN

## 2020-01-02 MED ORDER — ONDANSETRON HCL 4 MG PO TABS: 4.0000 mg | ORAL_TABLET | ORAL | Status: DC | PRN

## 2020-01-02 MED ORDER — LACTATED RINGERS IV SOLN: 500.0000 mL | INTRAVENOUS | Status: DC | PRN

## 2020-01-02 MED ORDER — OXYCODONE-ACETAMINOPHEN 5-325 MG PO TABS: 1.0000 | ORAL_TABLET | ORAL | Status: DC | PRN

## 2020-01-02 MED ORDER — ACETAMINOPHEN 325 MG PO TABS: 650.0000 mg | ORAL_TABLET | ORAL | Status: DC | PRN

## 2020-01-02 MED ORDER — SOD CITRATE-CITRIC ACID 500-334 MG/5ML PO SOLN: 30.0000 mL | ORAL | Status: DC | PRN

## 2020-01-02 MED ORDER — SODIUM CHLORIDE 0.9 % IV SOLN
5.0000 10*6.[IU] | Freq: Once | INTRAVENOUS | Status: AC
  Administered 2020-01-02: 5 10*6.[IU] via INTRAVENOUS
  Filled 2020-01-02: qty 5

## 2020-01-02 MED ORDER — WITCH HAZEL-GLYCERIN EX PADS: 1.0000 "application " | MEDICATED_PAD | CUTANEOUS | Status: DC | PRN

## 2020-01-02 MED ORDER — FLEET ENEMA 7-19 GM/118ML RE ENEM: 1.0000 | ENEMA | RECTAL | Status: DC | PRN

## 2020-01-02 MED ORDER — OXYCODONE-ACETAMINOPHEN 5-325 MG PO TABS: 2.0000 | ORAL_TABLET | ORAL | Status: DC | PRN

## 2020-01-02 MED ORDER — ONDANSETRON HCL 4 MG/2ML IJ SOLN: 4.0000 mg | INTRAMUSCULAR | Status: DC | PRN

## 2020-01-02 MED ORDER — OXYTOCIN 40 UNITS IN NORMAL SALINE INFUSION - SIMPLE MED
2.5000 [IU]/h | INTRAVENOUS | Status: DC
  Administered 2020-01-02: 2.5 [IU]/h via INTRAVENOUS

## 2020-01-02 MED ORDER — TERBUTALINE SULFATE 1 MG/ML IJ SOLN: 0.2500 mg | Freq: Once | INTRAMUSCULAR | Status: DC | PRN

## 2020-01-02 MED ORDER — FENTANYL CITRATE (PF) 100 MCG/2ML IJ SOLN
100.0000 ug | INTRAMUSCULAR | Status: DC | PRN
  Administered 2020-01-02: 100 ug via INTRAVENOUS
  Filled 2020-01-02: qty 2

## 2020-01-02 MED ORDER — DIBUCAINE (PERIANAL) 1 % EX OINT: 1.0000 "application " | TOPICAL_OINTMENT | CUTANEOUS | Status: DC | PRN

## 2020-01-02 MED ORDER — DIPHENHYDRAMINE HCL 25 MG PO CAPS: 25.0000 mg | ORAL_CAPSULE | Freq: Four times a day (QID) | ORAL | Status: DC | PRN

## 2020-01-02 MED ORDER — OXYTOCIN BOLUS FROM INFUSION
500.0000 mL | Freq: Once | INTRAVENOUS | Status: AC
  Administered 2020-01-02: 500 mL via INTRAVENOUS

## 2020-01-02 MED ORDER — ONDANSETRON HCL 4 MG/2ML IJ SOLN: 4.0000 mg | Freq: Four times a day (QID) | INTRAMUSCULAR | Status: DC | PRN

## 2020-01-02 MED ORDER — SENNOSIDES-DOCUSATE SODIUM 8.6-50 MG PO TABS
2.0000 | ORAL_TABLET | ORAL | Status: DC
  Filled 2020-01-02: qty 2

## 2020-01-02 MED ORDER — BENZOCAINE-MENTHOL 20-0.5 % EX AERO: 1.0000 "application " | INHALATION_SPRAY | CUTANEOUS | Status: DC | PRN

## 2020-01-02 MED ORDER — LACTATED RINGERS IV SOLN: INTRAVENOUS | Status: DC

## 2020-01-02 MED ORDER — OXYTOCIN 40 UNITS IN NORMAL SALINE INFUSION - SIMPLE MED
1.0000 m[IU]/min | INTRAVENOUS | Status: DC
  Administered 2020-01-02: 1 m[IU]/min via INTRAVENOUS
  Filled 2020-01-02: qty 1000

## 2020-01-02 MED ORDER — SIMETHICONE 80 MG PO CHEW: 80.0000 mg | CHEWABLE_TABLET | ORAL | Status: DC | PRN

## 2020-01-02 MED ORDER — PRENATAL MULTIVITAMIN CH
1.0000 | ORAL_TABLET | Freq: Every day | ORAL | Status: DC
  Administered 2020-01-03: 11:00:00 1 via ORAL
  Filled 2020-01-02: qty 1

## 2020-01-02 NOTE — Lactation Note (Signed)
This note was copied from a Margaret's chart. Lactation Consultation Note  Patient Name: Margaret Leonard Date: 01/02/2020 Reason for consult: Initial assessment;Term  2228 - 2239 - I conducted an initial consult with Margaret Leonard. She is a multip who has breast fed her previous four children for around 14 months each.   She states that Margaret Leonard is latching well thus far. She fed in recovery and has fed three times since that time (7 hours old).  She denies pain with latch and does not have any concerns.  She did ask about the purpose of the spoon, and I reviewed hand expression and supplementation by finger or spoon particularly during times when Margaret is sleepy at the breast.  I recommended breast feeding 8-12 times a day on demand.   Margaret Leonard has a personal DEBP at home. She states that she never pumped with her previous 4 kids; she states that she will keep the pump around for a few weeks and then probably give it away. She plans to exclusively breast feed.  No further questions. Margaret Leonard states that she will call lactation tomorrow if her night is challenging tonight, but otherwise, she is doing okay and will not need lactation follow up.   Maternal Data Has patient been taught Hand Expression?: Yes  Feeding Feeding Type: Breast Fed   Interventions Interventions: Breast feeding basics reviewed  Lactation Tools Discussed/Used     Consult Status Consult Status: PRN    Walker Shadow 01/02/2020, 11:14 PM

## 2020-01-02 NOTE — Progress Notes (Signed)
Subjective:    Coping well w/ ctx. Discussed birth plan: last birth in 2019 managed w/ IV sedation and nitrous oxide. Pt is open to all pain management options. Has used epidural in the past.   Objective:    VS: BP 105/80 (BP Location: Left Arm)   Pulse 81   Temp 98.1 F (36.7 C) (Oral)   Resp 18   Ht 5\' 5"  (1.651 m)   Wt 65.5 kg   BMI 24.05 kg/m  FHR : baseline 130 / variability moderate / accelerations presnt / no decelerations Toco: contractions every 7-12 minutes / moderate Membranes: SROM since 0100 Dilation: 4.5 Effacement (%): 80 Station: 0 Presentation: Vertex Exam by:: Dulcey Riederer cnm  Assessment/Plan:   34 y.o. 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

## 2020-01-02 NOTE — MAU Note (Signed)
Patient reports SROM at 0100-clear fluid. Contractions have spaced out to 8 minutes.  Some pink tinged mucous but no abnormal discharge.  Endorses + FM.  Patient reports UTI since January that she has been on 5 different antibiotics for.  No other complications w/ her pregnancy.

## 2020-01-02 NOTE — H&P (Signed)
Margaret Leonard is a 34 y.o. female, G5P4004, IUP at 40.3 weeks, presenting for PROM, clear fluid around 0100, then cxt started to happen, every 8 mins now. H/O anemia, hgb 10.9 at NOB, cystocele in 2013, GBS+, and reoccurring UTI, on clindamycin 300mg  daily for suppressive therapy.  EFW on 3/25 was 5.5lbs. Pt endorse + Fm.  Denies vaginal bleeding.    Patient Active Problem List   Diagnosis Date Noted  . Normal labor 01/02/2020  . Positive GBS test 09/29/2019  . Full-term premature rupture of membranes, unspecified as to length of time between rupture and onset of labor 12/09/2017  . Rupture of membranes with delay of delivery 12/09/2017  . SVD (spontaneous vaginal delivery) 12/09/2017  . Active labor at term 12/12/2013  . Bladder prolapse 01/15/2012  . Thrombocytopenia complicating pregnancy, third trimester 01/15/2012  . Vaginal delivery 12/11/2011  . Supervision of normal pregnancy 12/09/2011  . Second-degree perineal laceration, with delivery 12/09/2011     Medications Prior to Admission  Medication Sig Dispense Refill Last Dose  . clindamycin (CLEOCIN) 300 MG capsule Take 300 mg by mouth 3 (three) times daily.   01/01/2020 at 1900  . Prenatal Vit-Fe Fumarate-FA (PRENATAL MULTIVITAMIN) TABS Take 1 tablet by mouth daily.    01/02/2020 at Unknown time  . Cod Liver Oil (COD LIVER PO) Take 1 capsule by mouth daily.     . miconazole (MICOTIN) 100 MG vaginal suppository Place 100 mg vaginally at bedtime.     . penicillin G potassium 5 Million Units in dextrose 5 % 250 mL Inject 5 Million Units into the vein every 4 (four) hours.       Past Medical History:  Diagnosis Date  . Bacterial infection   . Female bladder prolapse   . FHx: asthma   . FHx: cancer   . FHx: diabetes mellitus   . FHx: heart disease   . FHx: hypertension   . GBS carrier   . H/O varicella   . Infection    UTI  . Urinary tract infection   . Yeast infection      No current facility-administered medications on  file prior to encounter.   Current Outpatient Medications on File Prior to Encounter  Medication Sig Dispense Refill  . clindamycin (CLEOCIN) 300 MG capsule Take 300 mg by mouth 3 (three) times daily.    . Prenatal Vit-Fe Fumarate-FA (PRENATAL MULTIVITAMIN) TABS Take 1 tablet by mouth daily.     . Cod Liver Oil (COD LIVER PO) Take 1 capsule by mouth daily.    . miconazole (MICOTIN) 100 MG vaginal suppository Place 100 mg vaginally at bedtime.    . penicillin G potassium 5 Million Units in dextrose 5 % 250 mL Inject 5 Million Units into the vein every 4 (four) hours.       Allergies  Allergen Reactions  . Ceclor [Cefaclor] Hives  . Tdap [Tetanus-Diphth-Acell Pertussis] Swelling    Severe leg swelling  . Vancomycin Other (See Comments)    Red man reaction    History of present pregnancy: Pt Info/Preference:  Screening/Consents:  Labs:   EDD: Estimated Date of Delivery: 12/30/19  Establised: No LMP recorded. Patient is pregnant.  Anatomy Scan: Date: 08/05/2019 Placenta Location: posterior Genetic Screen: Panoroma:low risk-female AFP:  First Tri: Quad:  Office: [facility]             First PNV: 28.2 wg [facility] Blood Type --/--/PENDING (05/03 0357)A+  Language: english Last PNV: 37.4 wg Rhogam  N/A  Flu Vaccine:  declined   Antibody PENDING (05/03 0357)  TDaP vaccine declined   GTT: Early: 5.1 Third Trimester: WNL  Feeding Plan: breast BTL: no Rubella: Immune (02/11 0000)  Contraception: ??? VBAC: no RPR: Nonreactive (02/11 0000)   Circumcision: N/A   HBsAg: Negative (02/11 0000)  Pediatrician:  ???   HIV: Non-reactive (02/11 0000)   Prenatal Classes: no Additional [place]: Growth: EFW on 3/25 was 5.5lbs. GBS: Positive/-- (02/11 0000)(For PCN allergy, check sensitivities)       Chlamydia: neg    MFM Referral/Consult:  GC: neg  Support Person: husband   PAP: ???  Pain Management: epidural Neonatologist Referral:  Hgb Electrophoresis:  AA  Birth Plan: none   Hgb NOB: 13.6    28W:  10.9   OB History    Gravida  5   Para  4   Term  4   Preterm  0   AB  0   Living  4     SAB  0   TAB  0   Ectopic  0   Multiple  0   Live Births  4          Past Medical History:  Diagnosis Date  . Bacterial infection   . Female bladder prolapse   . FHx: asthma   . FHx: cancer   . FHx: diabetes mellitus   . FHx: heart disease   . FHx: hypertension   . GBS carrier   . H/O varicella   . Infection    UTI  . Urinary tract infection   . Yeast infection    Past Surgical History:  Procedure Laterality Date  . WISDOM TOOTH EXTRACTION     Family History: family history includes Birth defects in her mother; Cancer in her paternal grandmother; Diabetes in her maternal grandfather; Heart disease in her paternal grandfather; Hypertension in her maternal grandfather and mother; Other in her cousin. Social History:  reports that she has never smoked. She has never used smokeless tobacco. She reports that she does not drink alcohol or use drugs.   Prenatal Transfer Tool  Maternal Diabetes: No Genetic Screening: Normal Maternal Ultrasounds/Referrals: Normal Fetal Ultrasounds or other Referrals:  None Maternal Substance Abuse:  No Significant Maternal Medications:  None Significant Maternal Lab Results: Group B Strep positive  ROS:  Review of Systems  Constitutional: Negative.   HENT: Negative.   Eyes: Negative.   Respiratory: Negative.   Cardiovascular: Negative.   Gastrointestinal: Positive for abdominal pain.  Genitourinary:       Vaginal leakage   Musculoskeletal: Negative.   Skin: Negative.   Neurological: Negative.   Endo/Heme/Allergies: Negative.   Psychiatric/Behavioral: Negative.      Physical Exam: BP 114/65   Pulse 91   Temp 98.6 F (37 C)   Resp 16   Wt 65.5 kg   BMI 24.05 kg/m   Physical Exam  Constitutional: She is oriented to person, place, and time and well-developed, well-nourished, and in no distress.  HENT:  Head:  Normocephalic and atraumatic.  Eyes: Pupils are equal, round, and reactive to light. Conjunctivae are normal.  Cardiovascular: Normal rate and regular rhythm.  Pulmonary/Chest: Effort normal and breath sounds normal.  Abdominal: Soft. Bowel sounds are normal.  Genitourinary:    Genitourinary Comments: Uterus gravida equal to dates, uterus soft non-tender.    Musculoskeletal:     Cervical back: Normal range of motion and neck supple.  Neurological: She is alert and oriented to person, place, and  time. She has normal reflexes. Gait normal.  Skin: Skin is warm and dry.  Psychiatric: Affect normal.  Nursing note and vitals reviewed.    NST: FHR baseline 135 bpm, Variability: moderate, Accelerations:present, Decelerations:  Absent= Cat 1/Reactive UC:   irregular, every 1 in 10 minutes SVE:   Dilation: 3 Effacement (%): 60 Station: -2 Exam by:: Latricia Heft, RN, vertex verified by fetal sutures.  Leopold's: Position vertex, EFW 6lbs via leopold's.   Labs: Results for orders placed or performed during the hospital encounter of 01/02/20 (from the past 24 hour(s))  Fern Test     Status: Abnormal   Collection Time: 01/02/20  3:13 AM  Result Value Ref Range   POCT Fern Test Positive = ruptured amniotic membanes (A)   CBC     Status: Abnormal   Collection Time: 01/02/20  3:57 AM  Result Value Ref Range   WBC 6.9 4.0 - 10.5 K/uL   RBC 3.81 (L) 3.87 - 5.11 MIL/uL   Hemoglobin 12.2 12.0 - 15.0 g/dL   HCT 93.9 03.0 - 09.2 %   MCV 96.1 80.0 - 100.0 fL   MCH 32.0 26.0 - 34.0 pg   MCHC 33.3 30.0 - 36.0 g/dL   RDW 33.0 07.6 - 22.6 %   Platelets 172 150 - 400 K/uL   nRBC 0.0 0.0 - 0.2 %  Type and screen [facility]     Status: None (Preliminary result)   Collection Time: 01/02/20  3:57 AM  Result Value Ref Range   ABO/RH(D) PENDING    Antibody Screen PENDING    Sample Expiration      01/05/2020,2359 Performed at [facility], [address] [place], [place]  [address]     Imaging:  No results found.  MAU Course: Orders Placed This Encounter  Procedures  . Respiratory Panel by RT PCR (Flu A&B, Covid) - Nasopharyngeal Swab  . CBC  . RPR  . Diet clear liquid Room service appropriate? Yes; Fluid consistency: Thin  . Vitals signs per unit policy  . Notify Physician  . Fetal monitoring per unit policy  . Activity as tolerated  . Measure blood pressure post delivery every 15 min x 1 hour then every 30 min x 1 hour  . Fundal check post delivery every 15 min x 1 hour then every 30 min x 1 hour  . If Rapid HIV test positive or known HIV positive: initiate AZT orders  . May in and out cath x 2 for inability to void  . Insert foley catheter  . Discontinue foley prior to vaginal delivery  . Initiate Carrier Fluid Protocol  . Initiate Oral Care Protocol  . Order Rapid HIV per protocol if no results on chart  . Full code  . Fern Test  . Type and screen [facility]  . Insert and maintain IV Line  . Admit to Inpatient (patient's expected length of stay will be greater than 2 midnights or inpatient only procedure)   Meds ordered this encounter  Medications  . lactated ringers infusion  . oxytocin (PITOCIN) IV BOLUS FROM BAG  . oxytocin (PITOCIN) IV infusion 40 units in NS 1000 mL - Premix  . lactated ringers infusion 500-1,000 mL  . acetaminophen (TYLENOL) tablet 650 mg  . oxyCODONE-acetaminophen (PERCOCET/ROXICET) 5-325 MG per tablet 1 tablet  . oxyCODONE-acetaminophen (PERCOCET/ROXICET) 5-325 MG per tablet 2 tablet  . sodium phosphate (FLEET) 7-19 GM/118ML enema 1 enema  . ondansetron (ZOFRAN) injection  4 mg  . sodium citrate-citric acid (ORACIT) solution 30 mL  . lidocaine (PF) (XYLOCAINE) 1 % injection 30 mL  . FOLLOWED BY Linked Order Group   . penicillin G potassium 5 Million Units in sodium chloride 0.9 % 250 mL IVPB     Order Specific Question:   Antibiotic Indication:     Answer:   Group B Strep Prophylaxis   .  penicillin G potassium 3 Million Units in dextrose 64mL IVPB     Order Specific Question:   Antibiotic Indication:     Answer:   Group B Strep Prophylaxis    Assessment/Plan: Margaret Leonard is a 34 y.o. female, [id number], IUP at 40.3 weeks, presenting for PROM, clear fluid around 0100, then cxt started to happen, every 8 mins now. H/O anemia, hgb 10.9 at NOB, cystocele in 2013, GBS+, and reoccurring UTI, on clindamycin 300mg  daily for suppressive therapy.  EFW on 3/25 was 5.5lbs. Pt endorse + Fm.  Denies vaginal bleeding.     FWB: Cat 1 Fetal Tracing.   Plan: Admit to [place] per consult with Dr Simona Huh Routine [facility] orders Pain med/epidural prn [facility] for GBS Prophylactics.  Anticipate needing pitocin for Augmentin once x2 doses of PCN given.  Anticipate labor progression   Noralyn Pick NP-C, CNM, MSN 01/02/2020, 4:52 AM

## 2020-01-03 DIAGNOSIS — O479 False labor, unspecified: Secondary | ICD-10-CM | POA: Diagnosis present

## 2020-01-03 LAB — CBC
HCT: 34.1 % — ABNORMAL LOW (ref 36.0–46.0)
Hemoglobin: 11.6 g/dL — ABNORMAL LOW (ref 12.0–15.0)
MCH: 32.7 pg (ref 26.0–34.0)
MCHC: 34 g/dL (ref 30.0–36.0)
MCV: 96.1 fL (ref 80.0–100.0)
Platelets: 157 10*3/uL (ref 150–400)
RBC: 3.55 MIL/uL — ABNORMAL LOW (ref 3.87–5.11)
RDW: 13.1 % (ref 11.5–15.5)
WBC: 9.5 10*3/uL (ref 4.0–10.5)
nRBC: 0 % (ref 0.0–0.2)

## 2020-01-03 MED ORDER — IBUPROFEN 600 MG PO TABS: 600.0000 mg | ORAL_TABLET | Freq: Four times a day (QID) | ORAL | 0 refills | Status: AC

## 2020-01-03 NOTE — Discharge Summary (Signed)
Postpartum Discharge Summary      Patient Name: Margaret Leonard DOB: 1986/02/29 MRN: [id number]  Date of admission: 01/02/2020 Delivering Provider: Burman Foster B   Date of discharge: 01/03/2020  Admitting diagnosis: Normal labor [O80, Z37.9] Intrauterine pregnancy: [redacted]w[redacted]d    Secondary diagnosis:  Active Problems:   SVD (5/3)   First degree laceration of perineum during delivery, postpartum   Postpartum care following vaginal delivery  Additional problems: recurrent UTIs, reports feeling much better     Discharge diagnosis: Term Pregnancy Delivered                                                                                                Post partum procedures:NA  Augmentation: Pitocin  Complications: None  Hospital course:  Onset of Labor With Vaginal Delivery     34y.o. yo G5P5005 at 439w3das admitted in Latent Labor on 01/02/2020. Patient had an uncomplicated labor course as follows:  Membrane Rupture Time/Date: 1:00 AM ,01/02/2020   Intrapartum Procedures: Episiotomy: None [1]                                         Lacerations:  1st degree [2]  Patient had a delivery of a Viable infant. 01/02/2020  Information for the patient's newborn:  SmMilton, Streicher0[[id number]]Delivery Method: Vaginal, Spontaneous(Filed from Delivery Summary)     Pateint had an uncomplicated postpartum course.  She is ambulating, tolerating a regular diet, passing flatus, and urinating well. Patient is discharged home in stable condition on 01/03/20.  Delivery time: 2:42 PM    Magnesium Sulfate received: No BMZ received: No Rhophylac:N/A MMR:N/A Transfusion:No  Physical exam  Vitals:   01/02/20 1755 01/02/20 2134 01/03/20 0129 01/03/20 0527  BP: 104/69 112/64 108/79 107/85  Pulse: 89 75 72 79  Resp: 18 18 18 18   Temp: 98.5 F (36.9 C) 97.7 F (36.5 C)  98.4 F (36.9 C)  TempSrc: Oral Oral  Oral  SpO2: 98% 97% 98% 97%  Weight:      Height:       General: alert, cooperative  and no distress Lochia: appropriate Uterine Fundus: firm Incision: N/A DVT Evaluation: No evidence of DVT seen on physical exam. Negative Homan's sign. No cords or calf tenderness. No significant calf/ankle edema. Labs: Lab Results  Component Value Date   WBC 9.5 01/03/2020   HGB 11.6 (L) 01/03/2020   HCT 34.1 (L) 01/03/2020   MCV 96.1 01/03/2020   PLT 157 01/03/2020   CMP Latest Ref Rng & Units 11/03/2019  Glucose 70 - 99 mg/dL 72  BUN 6 - 20 mg/dL <5(L)  Creatinine 0.44 - 1.00 mg/dL 0.47  Sodium 135 - 145 mmol/L 137  Potassium 3.5 - 5.1 mmol/L 4.0  Chloride 98 - 111 mmol/L 105  CO2 22 - 32 mmol/L 24  Calcium 8.9 - 10.3 mg/dL 8.9  Total Protein 6.5 - 8.1 g/dL 6.1(L)  Total Bilirubin 0.3 - 1.2 mg/dL 0.5  Alkaline Phos 38 -  126 U/L 54  AST 15 - 41 U/L 16  ALT 0 - 44 U/L 12   Edinburgh Score: Edinburgh Postnatal Depression Scale Screening Tool 12/10/2017  I have been able to laugh and see the funny side of things. 0  I have looked forward with enjoyment to things. 0  I have blamed myself unnecessarily when things went wrong. 2  I have been anxious or worried for no good reason. 2  I have felt scared or panicky for no good reason. 2  Things have been getting on top of me. 1  I have been so unhappy that I have had difficulty sleeping. 0  I have felt sad or miserable. 0  I have been so unhappy that I have been crying. 0  The thought of harming myself has occurred to me. 0  Edinburgh Postnatal Depression Scale Total 7    Discharge instruction: per After Visit Summary and "Baby and Me Booklet".  After visit meds:  Allergies as of 01/03/2020      Reactions   Ceclor [cefaclor] Hives   Other Swelling   Severe leg swelling   Tdap [tetanus-diphth-acell Pertussis] Swelling   Severe leg swelling   Vancomycin Other (See Comments)   Red man reaction      Medication List    STOP taking these medications   clindamycin 300 MG capsule Commonly known as: CLEOCIN   COD LIVER  PO   miconazole 100 MG vaginal suppository Commonly known as: MICOTIN   penicillin G potassium 5 Million Units in dextrose 5 % 250 mL   prenatal multivitamin Tabs tablet     TAKE these medications   ibuprofen 600 MG tablet Commonly known as: ADVIL Take 1 tablet (600 mg total) by mouth every 6 (six) hours.       Diet: routine diet  Activity: Advance as tolerated. Pelvic rest for 6 weeks.   Outpatient follow up:6 weeks Follow up Appt: Future Appointments  Date Time Provider [place]  01/04/2020  9:50 AM [facility] None   Follow up Visit: Follow-up Information    [facility], [place] Follow up in 6 week(s).   Specialty: Obstetrics and Gynecology Contact information: [address] [place] [address] [phone]             Newborn Data: Live born female  Birth Weight: 7 lb 10 oz (3459 g) APGAR: 8, 9  Newborn Delivery   Birth date/time: 01/02/2020 14:42:00 Delivery type: Vaginal, Spontaneous      Baby Feeding: Bottle and Breast Disposition:home with mother   01/03/2020 Ike Bene, CNM

## 2020-01-04 ENCOUNTER — Other Ambulatory Visit (HOSPITAL_COMMUNITY): Payer: Medicaid Other

## 2020-01-06 ENCOUNTER — Inpatient Hospital Stay (HOSPITAL_COMMUNITY)
Admission: RE | Admit: 2020-01-06 | Payer: Medicaid Other | Source: Home / Self Care | Admitting: Obstetrics and Gynecology

## 2020-01-06 ENCOUNTER — Inpatient Hospital Stay (HOSPITAL_COMMUNITY): Payer: Medicaid Other

## 2020-04-10 IMAGING — US US RENAL
1 series · 15 of 25 positions shown · non-contrast
Comparison: None.

CLINICAL DATA: 33-year-old pregnant female with flank pain.

EXAM:
RENAL / URINARY TRACT ULTRASOUND COMPLETE

[Series 1: us renal · 15 of 45 slices shown]
[im 1/45]
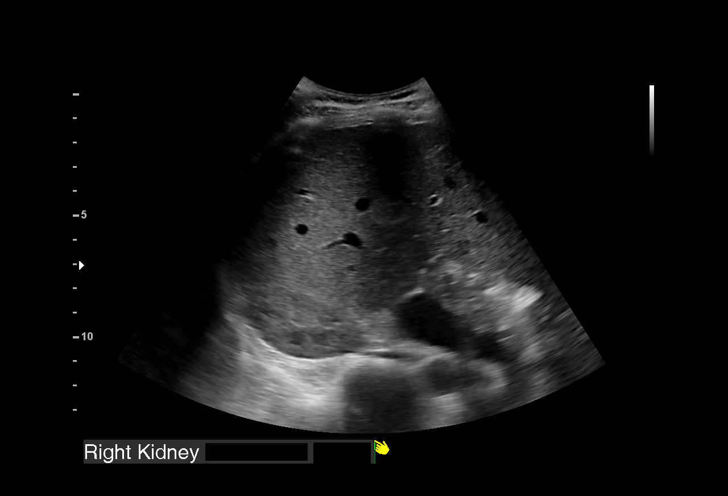
[im 4/45]
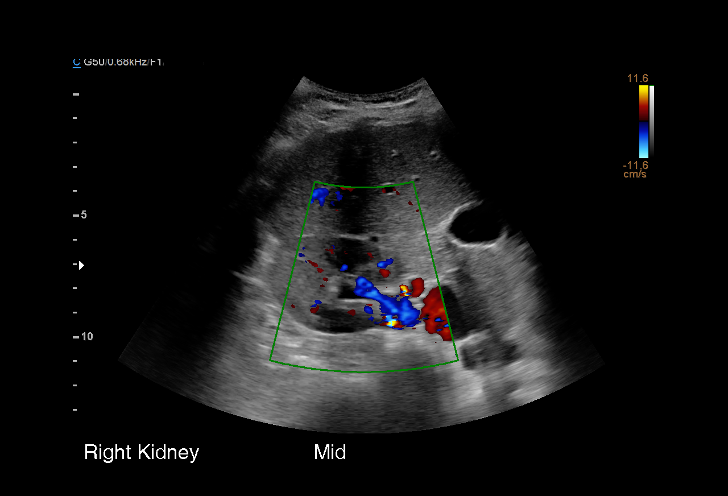
[im 8/45]
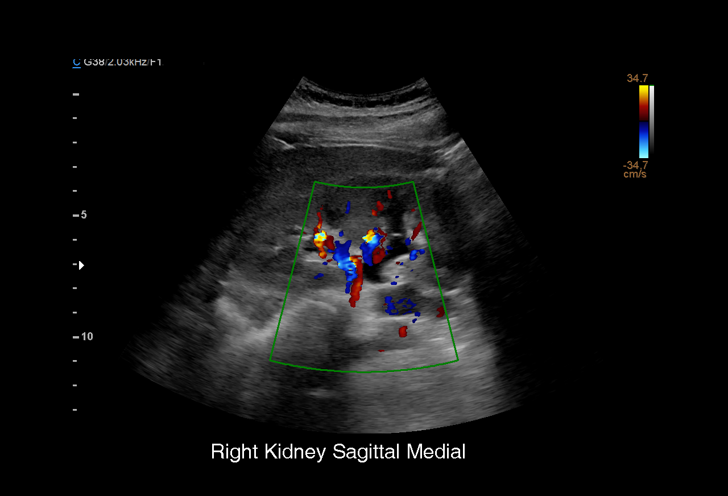
[im 10/45]
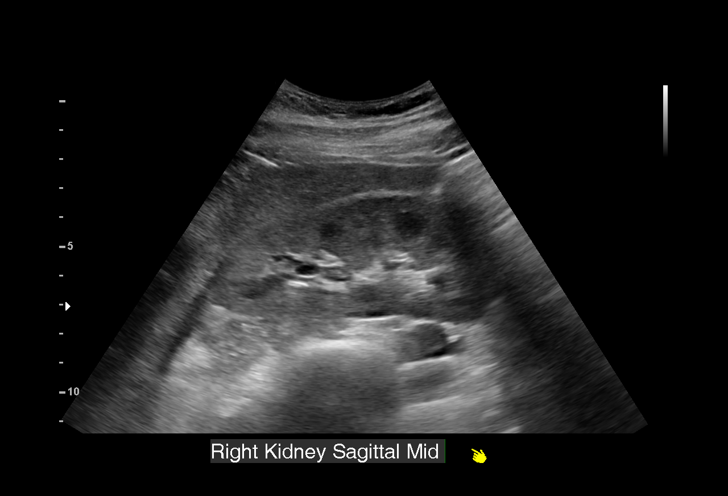
[im 13/45]
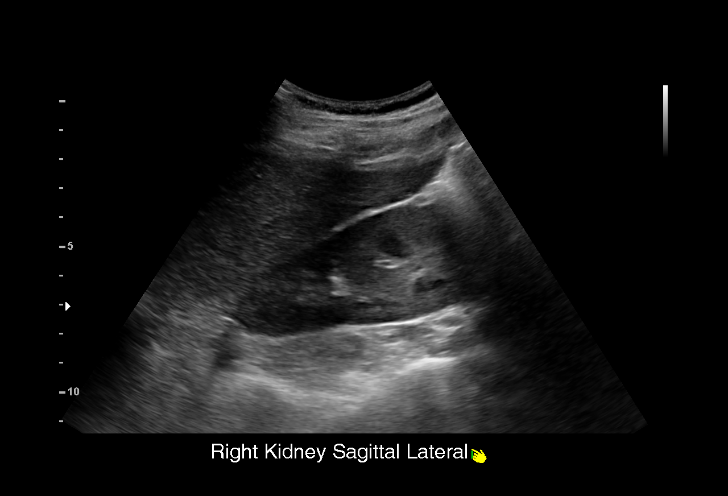
[im 17/45]
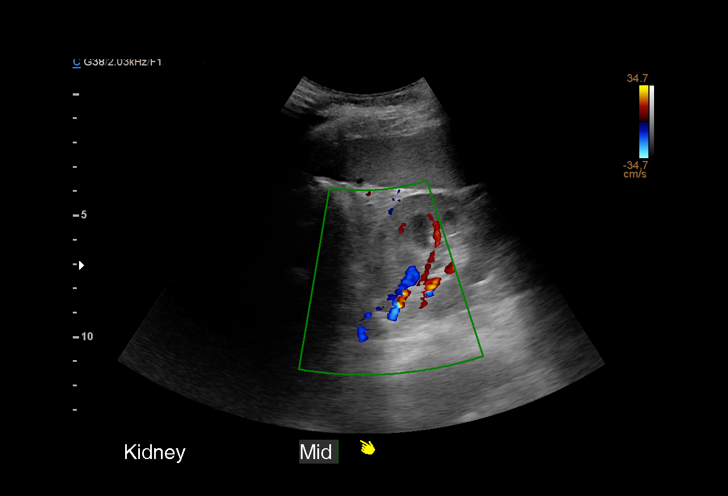
[im 19/45]
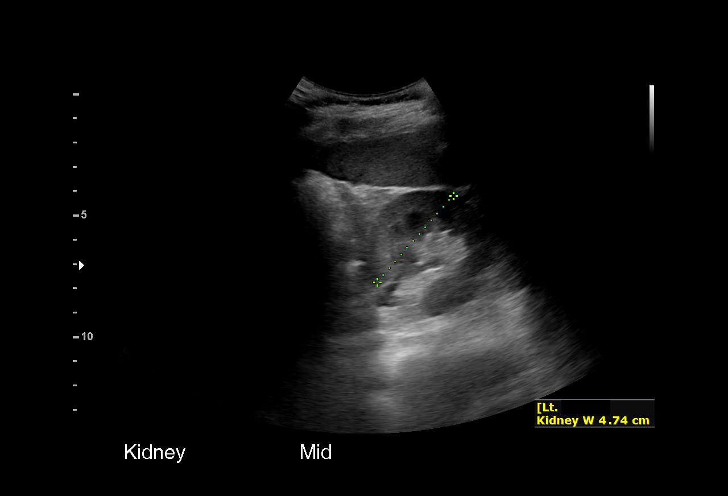
[im 23/45]
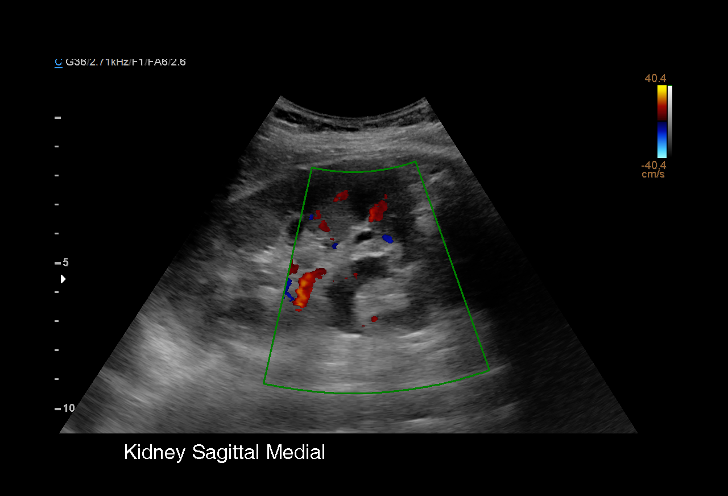
[im 26/45]
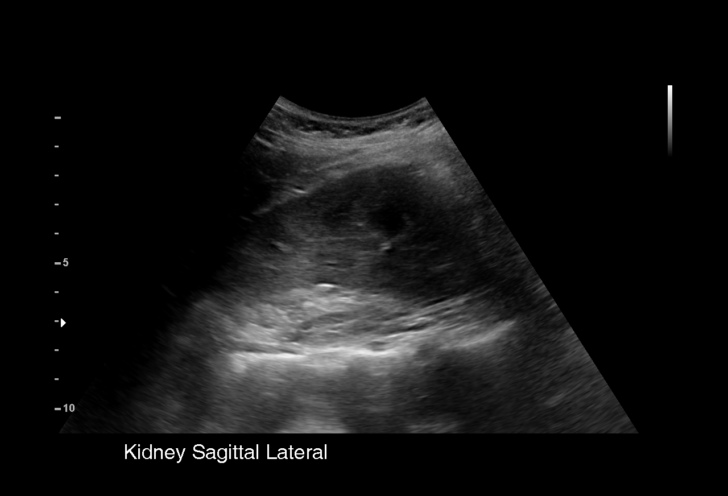
[im 28/45]
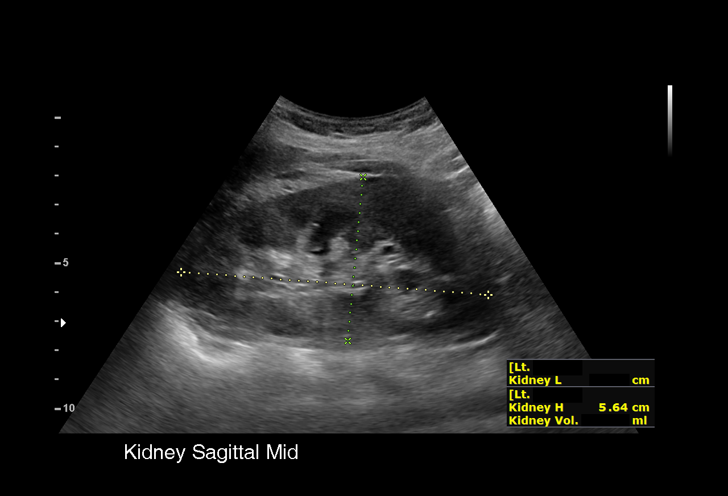
[im 32/45]
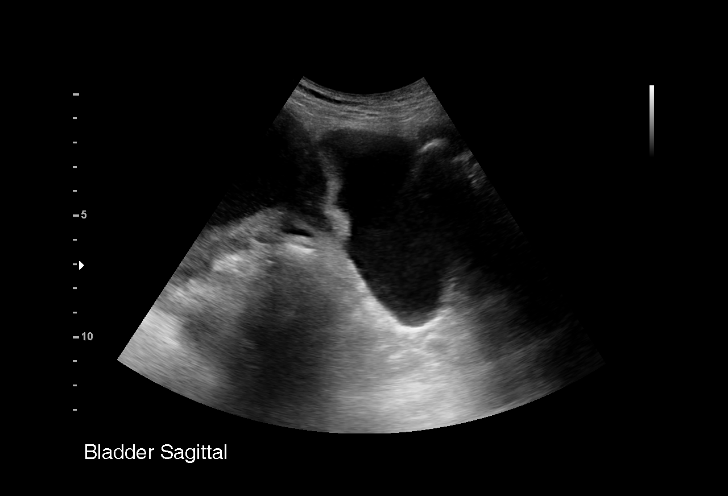
[im 35/45]
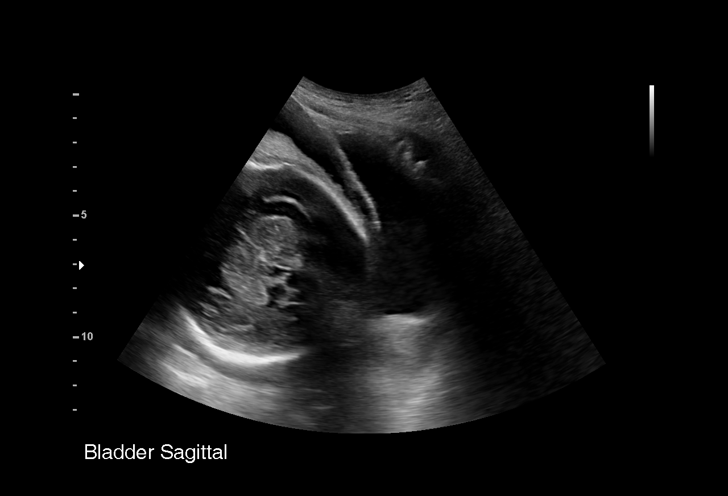
[im 37/45]
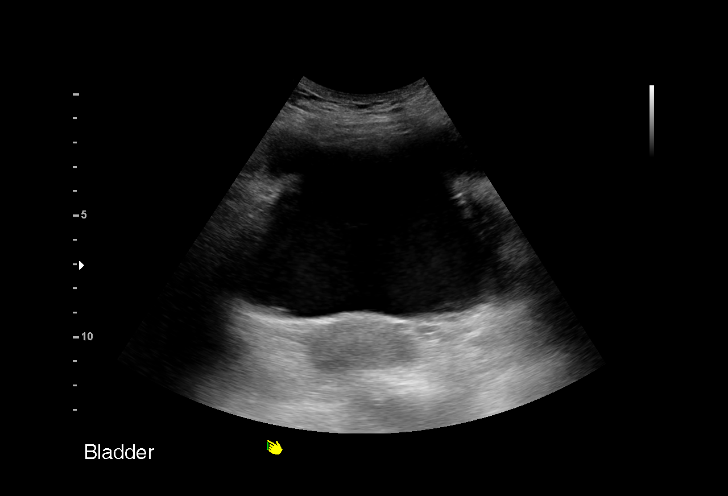
[im 41/45]
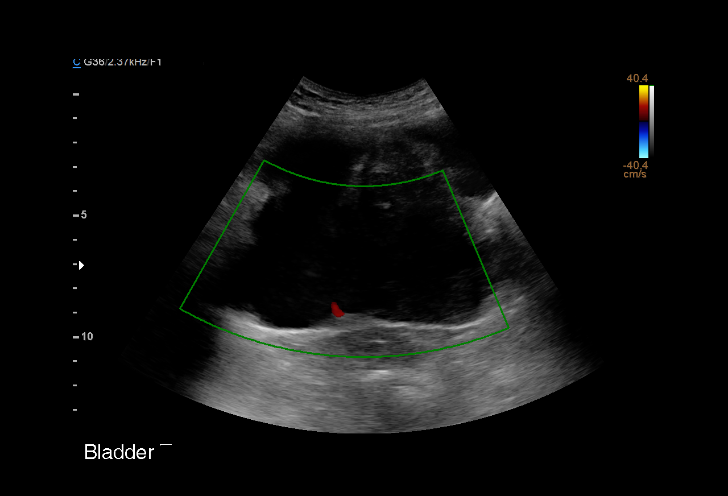
[im 45/45]
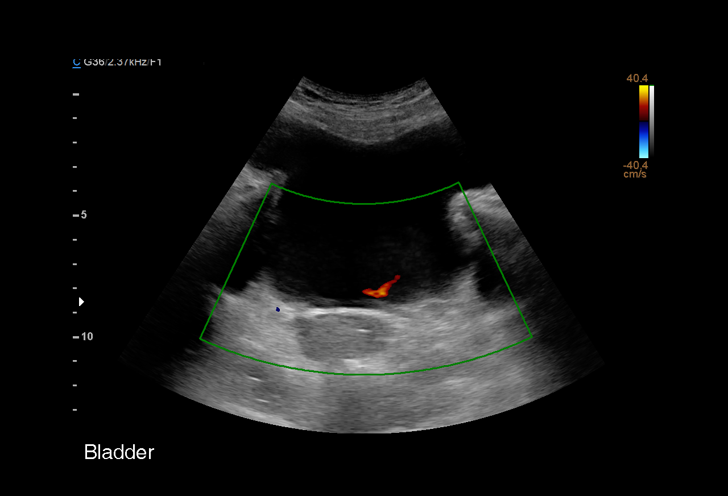

[15 of 25 positions shown; findings below may reference images not displayed]

FINDINGS: Right Kidney:

Renal measurements: 10.2 x 4.0 x 5.4 cm = volume: 115 mL. Normal
echogenicity. Mild hydronephrosis. No shadowing stone.

Left Kidney:

Renal measurements: 10.6 x 5.6 x 4.7 cm = volume: 148 mL. Mild
hydronephrosis. Normal echogenicity. No shadowing stone.

Bladder:

The urinary bladder is grossly unremarkable the degree of
distention. Bilateral ureteral jets noted. Artifact versus bladder
debris. Correlation with urinalysis recommended.

Other:

Partially visualized fetal head.
IMPRESSION: Mild bilateral hydronephrosis.
